# Patient Record
Sex: Male | Born: 1950
Health system: Southern US, Community
[De-identification: ages and names within clinical notes are randomized; demographics above are authoritative.]

## PROBLEM LIST (undated history)

## (undated) DIAGNOSIS — N200 Calculus of kidney: Secondary | ICD-10-CM

## (undated) DIAGNOSIS — J302 Other seasonal allergic rhinitis: Secondary | ICD-10-CM

## (undated) DIAGNOSIS — I10 Essential (primary) hypertension: Secondary | ICD-10-CM

## (undated) DIAGNOSIS — R001 Bradycardia, unspecified: Secondary | ICD-10-CM

## (undated) HISTORY — DX: Bradycardia, unspecified: R00.1

## (undated) HISTORY — DX: Calculus of kidney: N20.0

---

## 1982-01-30 DIAGNOSIS — N200 Calculus of kidney: Secondary | ICD-10-CM

## 1982-01-30 HISTORY — PX: OTHER SURGICAL HISTORY: SHX169

## 1982-01-30 HISTORY — DX: Calculus of kidney: N20.0

## 2012-08-07 ENCOUNTER — Telehealth: Payer: Self-pay | Admitting: Family Medicine

## 2012-08-07 NOTE — Telephone Encounter (Signed)
Patient needs BW paperwork

## 2012-08-08 NOTE — Telephone Encounter (Signed)
Need chart

## 2012-08-12 ENCOUNTER — Other Ambulatory Visit: Payer: Self-pay | Admitting: *Deleted

## 2012-08-12 DIAGNOSIS — Z0000 Encounter for general adult medical examination without abnormal findings: Secondary | ICD-10-CM

## 2012-08-12 DIAGNOSIS — Z125 Encounter for screening for malignant neoplasm of prostate: Secondary | ICD-10-CM

## 2012-08-12 DIAGNOSIS — Z79899 Other long term (current) drug therapy: Secondary | ICD-10-CM

## 2012-08-12 DIAGNOSIS — E785 Hyperlipidemia, unspecified: Secondary | ICD-10-CM

## 2012-08-12 NOTE — Telephone Encounter (Signed)
bloodwork papers ready for pick with a note that he needs physical. Pt notified on his voicemail

## 2012-08-12 NOTE — Telephone Encounter (Signed)
Lip liv met7 psa Needs pe too

## 2012-08-14 LAB — BASIC METABOLIC PANEL
CO2: 27 mEq/L (ref 19–32)
Calcium: 9.2 mg/dL (ref 8.4–10.5)
Chloride: 107 mEq/L (ref 96–112)
Glucose, Bld: 69 mg/dL — ABNORMAL LOW (ref 70–99)
Potassium: 4.6 mEq/L (ref 3.5–5.3)
Sodium: 142 mEq/L (ref 135–145)

## 2012-08-14 LAB — HEPATIC FUNCTION PANEL
AST: 14 U/L (ref 0–37)
Bilirubin, Direct: 0.1 mg/dL (ref 0.0–0.3)
Total Bilirubin: 0.4 mg/dL (ref 0.3–1.2)

## 2012-08-14 LAB — LIPID PANEL
LDL Cholesterol: 155 mg/dL — ABNORMAL HIGH (ref 0–99)
Total CHOL/HDL Ratio: 4.1 Ratio

## 2012-08-15 LAB — PSA: PSA: 0.66 ng/mL (ref <=4.00)

## 2012-08-17 ENCOUNTER — Encounter: Payer: Self-pay | Admitting: *Deleted

## 2012-08-19 ENCOUNTER — Ambulatory Visit (INDEPENDENT_AMBULATORY_CARE_PROVIDER_SITE_OTHER): Payer: BC Managed Care – PPO | Admitting: Family Medicine

## 2012-08-19 ENCOUNTER — Encounter: Payer: Self-pay | Admitting: Family Medicine

## 2012-08-19 VITALS — BP 122/78 | HR 60 | Ht 65.75 in | Wt 167.0 lb

## 2012-08-19 DIAGNOSIS — Z0000 Encounter for general adult medical examination without abnormal findings: Secondary | ICD-10-CM

## 2012-08-19 DIAGNOSIS — E785 Hyperlipidemia, unspecified: Secondary | ICD-10-CM

## 2012-08-19 MED ORDER — HYDROCORTISONE 2.5 % RE CREA: TOPICAL_CREAM | Freq: Two times a day (BID) | RECTAL | Status: DC

## 2012-08-19 NOTE — Progress Notes (Signed)
Subjective:    Patient ID: Miguel Morrow, male    DOB: [birth year], 62 y.o.   MRN: [id number]  HPI Exercises some, but not a lot. Does that a bit, walks a half milre  Diet overall pretty good, Eats stable  Scheduled colonoscopy for the first week of august Hemorrhoids flare up on occaison.  Results for orders placed in visit on 08/12/12  LIPID PANEL      Result Value Range   Cholesterol 221 (*) 0 - 200 mg/dL   Triglycerides 61  <150 mg/dL   HDL 54  >39 mg/dL   Total CHOL/HDL Ratio 4.1     VLDL 12  0 - 40 mg/dL   LDL Cholesterol 155 (*) 0 - 99 mg/dL  HEPATIC FUNCTION PANEL      Result Value Range   Total Bilirubin 0.4  0.3 - 1.2 mg/dL   Bilirubin, Direct 0.1  0.0 - 0.3 mg/dL   Indirect Bilirubin 0.3  0.0 - 0.9 mg/dL   Alkaline Phosphatase 41  39 - 117 U/L   AST 14  0 - 37 U/L   ALT 17  0 - 53 U/L   Total Protein 6.6  6.0 - 8.3 g/dL   Albumin 4.3  3.5 - 5.2 g/dL  BASIC METABOLIC PANEL      Result Value Range   Sodium 142  135 - 145 mEq/L   Potassium 4.6  3.5 - 5.3 mEq/L   Chloride 107  96 - 112 mEq/L   CO2 27  19 - 32 mEq/L   Glucose, Bld 69 (*) 70 - 99 mg/dL   BUN 20  6 - 23 mg/dL   Creat 0.77  0.50 - 1.35 mg/dL   Calcium 9.2  8.4 - 10.5 mg/dL  PSA      Result Value Range   PSA 0.66  <=4.00 ng/mL     Review of Systems  Constitutional: Negative for fever, activity change and appetite change.  HENT: Negative for congestion, rhinorrhea and neck pain.   Eyes: Negative for discharge.  Respiratory: Negative for cough and wheezing.   Cardiovascular: Negative for chest pain.  Gastrointestinal: Negative for vomiting, abdominal pain and blood in stool.  Genitourinary: Negative for frequency and difficulty urinating.  Skin: Negative for rash.  Allergic/Immunologic: Negative for environmental allergies and food allergies.  Neurological: Negative for weakness and headaches.  Psychiatric/Behavioral: Negative for agitation.       Objective:   Physical Exam   Constitutional: He appears well-developed and well-nourished.  HENT:  Head: Normocephalic and atraumatic.  Right Ear: External ear normal.  Left Ear: External ear normal.  Nose: Nose normal.  Mouth/Throat: Oropharynx is clear and moist.  Eyes: EOM are normal. Pupils are equal, round, and reactive to light.  Neck: Normal range of motion. Neck supple. No thyromegaly present.  Cardiovascular: Normal rate, regular rhythm and normal heart sounds.   No murmur heard. Pulmonary/Chest: Effort normal and breath sounds normal. No respiratory distress. He has no wheezes.  Abdominal: Soft. Bowel sounds are normal. He exhibits no distension and no mass. There is no tenderness.  Genitourinary: Prostate normal and penis normal. No penile tenderness.  Musculoskeletal: Normal range of motion. He exhibits no edema.  Lymphadenopathy:    He has no cervical adenopathy.  Neurological: He is alert. He exhibits normal muscle tone.  Skin: Skin is warm and dry. No erythema.  Psychiatric: He has a normal mood and affect. His behavior is normal. Judgment normal.  Assessment & Plan:  Impression wellness exam #2 intermittent irritation hemorrhoids #3 hyperlipidemia discussed plan diet diet and exercise encourage. Patient to have colonoscopy soon. If cholesterol does not improve will need to consider medicine. WSL

## 2012-08-19 NOTE — Patient Instructions (Addendum)

## 2012-08-20 DIAGNOSIS — E782 Mixed hyperlipidemia: Secondary | ICD-10-CM | POA: Insufficient documentation

## 2012-08-27 ENCOUNTER — Telehealth: Payer: Self-pay

## 2012-08-27 NOTE — Telephone Encounter (Signed)
Pt was referred by Dr. Mickie Hillier for screening colonoscopy/ Hemorrhoid/ OV with LL on 09/24/2012 at 9:00 AM.

## 2012-09-24 ENCOUNTER — Ambulatory Visit (INDEPENDENT_AMBULATORY_CARE_PROVIDER_SITE_OTHER): Payer: BC Managed Care – PPO | Admitting: Gastroenterology

## 2012-09-24 ENCOUNTER — Encounter: Payer: Self-pay | Admitting: Gastroenterology

## 2012-09-24 VITALS — BP 150/79 | HR 51 | Temp 97.4°F | Ht 65.0 in | Wt 169.0 lb

## 2012-09-24 DIAGNOSIS — R194 Change in bowel habit: Secondary | ICD-10-CM | POA: Insufficient documentation

## 2012-09-24 DIAGNOSIS — K649 Unspecified hemorrhoids: Secondary | ICD-10-CM | POA: Insufficient documentation

## 2012-09-24 DIAGNOSIS — R198 Other specified symptoms and signs involving the digestive system and abdomen: Secondary | ICD-10-CM

## 2012-09-24 MED ORDER — PEG-KCL-NACL-NASULF-NA ASC-C 100 G PO SOLR: 1.0000 | ORAL | Status: DC

## 2012-09-24 NOTE — Progress Notes (Signed)
CC'd to PCP 

## 2012-09-24 NOTE — Progress Notes (Signed)
Primary Care Physician:  Rubbie Battiest, MD  Primary Gastroenterologist:  Garfield Cornea, MD   Chief Complaint  Patient presents with  . Colonoscopy  . Hemorrhoids    HPI:  Miguel Morrow is a 62 y.o. male here for consideration of a colonoscopy. Overall he is doing well. November treated for hemorrhoids with hemorrhoid suppositories. Recently started itching again. No recent rectal bleeding. Soft stool twice per day for the past few weeks. Usually formed stool. Little abdominal pain after meals. Some foods will have BM within one hour. [place], [place]. No heartburn. No dysphagia. No weight loss. Good appetite. No prior colonoscopy.   Current Outpatient Prescriptions  Medication Sig Dispense Refill  . aspirin 325 MG tablet Take 325 mg by mouth daily.      . Aspirin-Caffeine (BAYER BACK & BODY PAIN EX ST) 500-32.5 MG TABS Take by mouth as needed.      . hydrocortisone (ANUSOL-HC) 2.5 % rectal cream Place rectally 2 (two) times daily.  30 g  2   No current facility-administered medications for this visit.    Allergies as of 09/24/2012  . (No Known Allergies)    Past Medical History  Diagnosis Date  . Kidney stones 1984    History reviewed. No pertinent past surgical history.  Family History  Problem Relation Age of Onset  . Colon cancer Paternal Aunt     greater than age of 6  . Liver disease Neg Hx   . Heart disease Father     History   Social History  . Marital Status: Married    Spouse Name: N/A    Number of Children: 2  . 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 Smoking status: Former Research scientist (life sciences)  . Smokeless tobacco: Former Systems developer    Quit date: 05/17/2008  . Alcohol Use: No  . Drug Use: No  . Sexual Activity: Not on file   Other Topics Concern  . Not on file   Social History Narrative  . No narrative on file      ROS:  General: Negative for anorexia, weight loss, fever, chills, fatigue, weakness. Eyes:  Negative for vision changes.  ENT: Negative for hoarseness, difficulty swallowing , nasal congestion. CV: Negative for chest pain, angina, palpitations, dyspnea on exertion, peripheral edema.  Respiratory: Negative for dyspnea at rest, dyspnea on exertion, cough, sputum, wheezing.  GI: See history of present illness. GU:  Negative for dysuria, hematuria, urinary incontinence, urinary frequency, nocturnal urination.  MS: Negative for joint pain, low back pain.  Derm: Negative for rash or itching.  Neuro: Negative for weakness, abnormal sensation, seizure, frequent headaches, memory loss, confusion.  Psych: Negative for anxiety, depression, suicidal ideation, hallucinations.  Endo: Negative for unusual weight change.  Heme: Negative for bruising or bleeding. Allergy: Negative for rash or hives.    Physical Examination:  BP 150/79  Pulse 51  Temp(Src) 97.4 F (36.3 C) (Oral)  Ht 5' 5"  (1.651 m)  Wt 169 lb (76.658 kg)  BMI 28.12 kg/m2   General: Well-nourished, well-developed in no acute distress.  Head: Normocephalic, atraumatic.   Eyes: Conjunctiva pink, no icterus. Mouth: Oropharyngeal mucosa moist and pink , no lesions erythema or exudate. Neck: Supple without thyromegaly, masses, or lymphadenopathy.  Lungs: Clear to auscultation bilaterally.  Heart: Regular rate and rhythm, no murmurs rubs or gallops.  Abdomen: Bowel sounds are normal, nontender, nondistended, no hepatosplenomegaly or masses, no abdominal bruits or  hernia , no rebound or guarding.   Rectal: Not performed Extremities: No lower extremity edema. No clubbing or deformities.  Neuro: Alert and oriented x 4 , grossly normal neurologically.  Skin: Warm and dry, no rash or jaundice.   Psych: Alert and cooperative, normal mood and affect.  Labs: Lab Results  Component Value Date   ALT 17 08/14/2012   AST 14 08/14/2012   ALKPHOS 41 08/14/2012   BILITOT 0.4 08/14/2012   Lab Results  Component Value Date    CREATININE 0.77 08/14/2012   BUN 20 08/14/2012   NA 142 08/14/2012   K 4.6 08/14/2012   CL 107 08/14/2012   CO2 27 08/14/2012     Imaging Studies: No results found.

## 2012-09-24 NOTE — Assessment & Plan Note (Signed)
62 year old gentleman who presents to schedule first ever screening colonoscopy. Last fall he had an episode of bright red blood per rectum and was diagnosed with hemorrhoid by his PCP. More recently he has had some change in bowel habits with softer stool but no increased frequency. Recommend colonoscopy in the near future. I have discussed the risks, alternatives, benefits with regards to but not limited to the risk of reaction to medication, bleeding, infection, perforation and the patient is agreeable to proceed. Written consent to be obtained.

## 2012-09-24 NOTE — Patient Instructions (Addendum)
We have scheduled a colonoscopy with Dr. Gala Romney. Please see separate instructions.

## 2012-09-25 ENCOUNTER — Encounter (HOSPITAL_COMMUNITY): Payer: Self-pay | Admitting: Pharmacy Technician

## 2012-09-26 ENCOUNTER — Ambulatory Visit (HOSPITAL_COMMUNITY)
Admission: RE | Admit: 2012-09-26 | Discharge: 2012-09-26 | Disposition: A | Discharge status: Home / Self Care | Payer: BC Managed Care – PPO | Source: Ambulatory Visit | Attending: Internal Medicine | Admitting: Internal Medicine

## 2012-09-26 ENCOUNTER — Encounter (HOSPITAL_COMMUNITY)
Admission: RE | Disposition: A | Discharge status: Home / Self Care | Payer: Self-pay | Source: Ambulatory Visit | Attending: Internal Medicine

## 2012-09-26 ENCOUNTER — Encounter (HOSPITAL_COMMUNITY): Payer: Self-pay | Admitting: *Deleted

## 2012-09-26 DIAGNOSIS — K621 Rectal polyp: Secondary | ICD-10-CM

## 2012-09-26 DIAGNOSIS — D126 Benign neoplasm of colon, unspecified: Secondary | ICD-10-CM | POA: Insufficient documentation

## 2012-09-26 DIAGNOSIS — K5730 Diverticulosis of large intestine without perforation or abscess without bleeding: Secondary | ICD-10-CM | POA: Insufficient documentation

## 2012-09-26 DIAGNOSIS — K620 Anal polyp: Secondary | ICD-10-CM

## 2012-09-26 DIAGNOSIS — D128 Benign neoplasm of rectum: Secondary | ICD-10-CM | POA: Insufficient documentation

## 2012-09-26 DIAGNOSIS — K649 Unspecified hemorrhoids: Secondary | ICD-10-CM

## 2012-09-26 DIAGNOSIS — Z1211 Encounter for screening for malignant neoplasm of colon: Secondary | ICD-10-CM | POA: Insufficient documentation

## 2012-09-26 HISTORY — PX: COLONOSCOPY: SHX5424

## 2012-09-26 SURGERY — COLONOSCOPY
Anesthesia: Moderate Sedation

## 2012-09-26 MED ORDER — STERILE WATER FOR IRRIGATION IR SOLN
Status: DC | PRN
  Administered 2012-09-26: 15:00:00

## 2012-09-26 MED ORDER — MIDAZOLAM HCL 5 MG/5ML IJ SOLN
INTRAMUSCULAR | Status: DC | PRN
  Administered 2012-09-26: 1 mg via INTRAVENOUS
  Administered 2012-09-26: 2 mg via INTRAVENOUS

## 2012-09-26 MED ORDER — SODIUM CHLORIDE 0.9 % IV SOLN
INTRAVENOUS | Status: DC
  Administered 2012-09-26: 1000 mL via INTRAVENOUS

## 2012-09-26 MED ORDER — MEPERIDINE HCL 100 MG/ML IJ SOLN
INTRAMUSCULAR | Status: DC | PRN
  Administered 2012-09-26: 50 mg via INTRAVENOUS

## 2012-09-26 MED ORDER — ONDANSETRON HCL 4 MG/2ML IJ SOLN
INTRAMUSCULAR | Status: AC
  Filled 2012-09-26: qty 2

## 2012-09-26 MED ORDER — ONDANSETRON HCL 4 MG/2ML IJ SOLN
INTRAMUSCULAR | Status: DC | PRN
  Administered 2012-09-26: 4 mg via INTRAVENOUS

## 2012-09-26 MED ORDER — MIDAZOLAM HCL 5 MG/5ML IJ SOLN
INTRAMUSCULAR | Status: AC
  Filled 2012-09-26: qty 10

## 2012-09-26 MED ORDER — MEPERIDINE HCL 100 MG/ML IJ SOLN
INTRAMUSCULAR | Status: AC
  Filled 2012-09-26: qty 2

## 2012-09-26 NOTE — H&P (View-Only) (Signed)
Primary Care Physician:  Rubbie Battiest, MD  Primary Gastroenterologist:  Garfield Cornea, MD   Chief Complaint  Patient presents with  . Colonoscopy  . Hemorrhoids    HPI:  Miguel Morrow is a 62 y.o. male here for consideration of a colonoscopy. Overall he is doing well. November treated for hemorrhoids with hemorrhoid suppositories. Recently started itching again. No recent rectal bleeding. Soft stool twice per day for the past few weeks. Usually formed stool. Little abdominal pain after meals. Some foods will have BM within one hour. [place], [place]. No heartburn. No dysphagia. No weight loss. Good appetite. No prior colonoscopy.   Current Outpatient Prescriptions  Medication Sig Dispense Refill  . aspirin 325 MG tablet Take 325 mg by mouth daily.      . Aspirin-Caffeine (BAYER BACK & BODY PAIN EX ST) 500-32.5 MG TABS Take by mouth as needed.      . hydrocortisone (ANUSOL-HC) 2.5 % rectal cream Place rectally 2 (two) times daily.  30 g  2   No current facility-administered medications for this visit.    Allergies as of 09/24/2012  . (No Known Allergies)    Past Medical History  Diagnosis Date  . Kidney stones 1984    History reviewed. No pertinent past surgical history.  Family History  Problem Relation Age of Onset  . Colon cancer Paternal Aunt     greater than age of 26  . Liver disease Neg Hx   . Heart disease Father     History   Social History  . Marital Status: Married    Spouse Name: N/A    Number of Children: 2  . 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 Smoking status: Former Research scientist (life sciences)  . Smokeless tobacco: Former Systems developer    Quit date: 05/17/2008  . Alcohol Use: No  . Drug Use: No  . Sexual Activity: Not on file   Other Topics Concern  . Not on file   Social History Narrative  . No narrative on file      ROS:  General: Negative for anorexia, weight loss, fever, chills, fatigue, weakness. Eyes:  Negative for vision changes.  ENT: Negative for hoarseness, difficulty swallowing , nasal congestion. CV: Negative for chest pain, angina, palpitations, dyspnea on exertion, peripheral edema.  Respiratory: Negative for dyspnea at rest, dyspnea on exertion, cough, sputum, wheezing.  GI: See history of present illness. GU:  Negative for dysuria, hematuria, urinary incontinence, urinary frequency, nocturnal urination.  MS: Negative for joint pain, low back pain.  Derm: Negative for rash or itching.  Neuro: Negative for weakness, abnormal sensation, seizure, frequent headaches, memory loss, confusion.  Psych: Negative for anxiety, depression, suicidal ideation, hallucinations.  Endo: Negative for unusual weight change.  Heme: Negative for bruising or bleeding. Allergy: Negative for rash or hives.    Physical Examination:  BP 150/79  Pulse 51  Temp(Src) 97.4 F (36.3 C) (Oral)  Ht 5' 5"  (1.651 m)  Wt 169 lb (76.658 kg)  BMI 28.12 kg/m2   General: Well-nourished, well-developed in no acute distress.  Head: Normocephalic, atraumatic.   Eyes: Conjunctiva pink, no icterus. Mouth: Oropharyngeal mucosa moist and pink , no lesions erythema or exudate. Neck: Supple without thyromegaly, masses, or lymphadenopathy.  Lungs: Clear to auscultation bilaterally.  Heart: Regular rate and rhythm, no murmurs rubs or gallops.  Abdomen: Bowel sounds are normal, nontender, nondistended, no hepatosplenomegaly or masses, no abdominal bruits or  hernia , no rebound or guarding.   Rectal: Not performed Extremities: No lower extremity edema. No clubbing or deformities.  Neuro: Alert and oriented x 4 , grossly normal neurologically.  Skin: Warm and dry, no rash or jaundice.   Psych: Alert and cooperative, normal mood and affect.  Labs: Lab Results  Component Value Date   ALT 17 08/14/2012   AST 14 08/14/2012   ALKPHOS 41 08/14/2012   BILITOT 0.4 08/14/2012   Lab Results  Component Value Date    CREATININE 0.77 08/14/2012   BUN 20 08/14/2012   NA 142 08/14/2012   K 4.6 08/14/2012   CL 107 08/14/2012   CO2 27 08/14/2012     Imaging Studies: No results found.

## 2012-09-26 NOTE — Interval H&P Note (Signed)
History and Physical Interval Note:  09/26/2012 2:46 PM  Miguel Morrow  has presented today for surgery, with the diagnosis of Hemorrhoid  The various methods of treatment have been discussed with the patient and family. After consideration of risks, benefits and other options for treatment, the patient has consented to  Procedure(s) with comments: COLONOSCOPY (N/A) - 1:45 as a surgical intervention .  The patient's history has been reviewed, patient examined, no change in status, stable for surgery.  I have reviewed the patient's chart and labs.  Questions were answered to the patient's satisfaction.     No change. Colonoscopy per plan.  The risks, benefits, limitations, alternatives and imponderables have been reviewed with the patient. Questions have been answered. All parties are agreeable.   Manus Rudd

## 2012-09-26 NOTE — Op Note (Signed)
[facility] [address] [place], [address]   COLONOSCOPY PROCEDURE REPORT  PATIENT: Miguel Morrow, Miguel Morrow  MR#:         [id number] BIRTHDATE: [birth year] , 61  yrs. old GENDER: Male ENDOSCOPIST: R.  Garfield Cornea, MD FACP FACG REFERRED BY:  Sallee Lange, M.D. PROCEDURE DATE:  09/26/2012 PROCEDURE:     Colonoscopy with snare polypectomy  INDICATIONS: First-ever average risk colorectal cancer screening examination  INFORMED CONSENT:  The risks, benefits, alternatives and imponderables including but not limited to bleeding, perforation as well as the possibility of a missed lesion have been reviewed.  The potential for biopsy, lesion removal, etc. have also been discussed.  Questions have been answered.  All parties agreeable. Please see the history and physical in the medical record for more information.  MEDICATIONS: Versed 3 mg IV and Demerol 50 mg IV in divided doses. Zofran 4 mg IV  DESCRIPTION OF PROCEDURE:  After a digital rectal exam was performed, the EC-[id number] ([id number])  colonoscope was advanced from the anus through the rectum and colon to the area of the cecum, ileocecal valve and appendiceal orifice.  The cecum was deeply intubated.  These structures were well-seen and photographed for the record.  From the level of the cecum and ileocecal valve, the scope was slowly and cautiously withdrawn.  The mucosal surfaces were carefully surveyed utilizing scope tip deflection to facilitate fold flattening as needed.  The scope was pulled down into the rectum where a thorough examination including retroflexion was performed.    FINDINGS:  Adequate preparation. (1) 8 mm pedunculated polyp in the rectum at 6 cm  from the anal verge; otherwise, the remainder of the rectal mucosa appeared normal. The patient also had scattered left-sided diverticula; (1) 8 mm polyp at the splenic flexure; otherwise, the remainder of the colonic mucosa appeared  normal.  THERAPEUTIC / DIAGNOSTIC MANEUVERS PERFORMED:  The above-mentioned polyps were hot snare removed and recovered for the pathologist  COMPLICATIONS: None.  CECAL WITHDRAWAL TIME:  10 minutes  IMPRESSION:  Rectal and colonic polyps-removed as described above. Colonic diverticulosis  The patient was observed to have a sinus bradycardia before, during and after the procedure. His rate dropped down as low as the upper 30s prior to the initiation of the procedure. He maintained a normal blood pressure and apparently has had no cardiac symptoms, whatsoever.   RECOMMENDATIONS:   Followup on pathology. I've advised the patient and family members to make an appointment to see Dr. Wolfgang Phoenix within the next week to further evaluate the  bradycardia observe today   _______________________________ eSigned:  R. Garfield Cornea, MD FACP [facility] 09/26/2012 3:20 PM   CC:    PATIENT NAME:  Miguel Morrow, Miguel Morrow MR#: [id number]

## 2012-10-01 ENCOUNTER — Encounter (HOSPITAL_COMMUNITY): Payer: Self-pay | Admitting: Internal Medicine

## 2012-10-02 ENCOUNTER — Encounter: Payer: Self-pay | Admitting: Family Medicine

## 2012-10-02 ENCOUNTER — Ambulatory Visit (INDEPENDENT_AMBULATORY_CARE_PROVIDER_SITE_OTHER): Payer: BC Managed Care – PPO | Admitting: Family Medicine

## 2012-10-02 ENCOUNTER — Encounter: Payer: Self-pay | Admitting: Internal Medicine

## 2012-10-02 VITALS — BP 138/94 | HR 64 | Ht 65.0 in | Wt 168.0 lb

## 2012-10-02 DIAGNOSIS — I498 Other specified cardiac arrhythmias: Secondary | ICD-10-CM

## 2012-10-02 DIAGNOSIS — R001 Bradycardia, unspecified: Secondary | ICD-10-CM

## 2012-10-02 NOTE — Progress Notes (Signed)
  Subjective:    Patient ID: Miguel Morrow, male    DOB: 1950-05-29, 62 y.o.   MRN: [id number]  [facility] colonoscopy last Thursday. Pt stated his heart rate dropped down 33 and never got above 53 during procedure.   There is a strong family history of early coronary artery disease and this has of patient concern.  Patient gets lightheaded at times, generally when standing quickly. No syncope.  He reports the gastroenterologist seem to be very concerned and his heart rate dropped. They did not send to the emergency room. Advise him to be sure he follows up with [place].  Patient has nonspecific pressure in the chest very sporadic not associated with exercise. No associated nausea diaphoresis shortness of breath.  Historically of note patient's father in and up having a pacemaker  Review of Systems Review systems otherwise normal.    Objective:   Physical Exam  Alert no acute distress. HEENT normal. Blood pressure repeat 1 3482. Lungs clear. Heart mild bradycardia noted abdomen benign  EKG sinus bradycardia. Nonspecific ST-T changes. Heart rate upper 40s      Assessment & Plan:  Impression bradycardia likely benign however with other features risk factors further evaluation warranted discussed at length 25 minutes spent most in discussion. Plan cardiology consult rationale discussed. WSL

## 2012-10-03 DIAGNOSIS — R001 Bradycardia, unspecified: Secondary | ICD-10-CM | POA: Insufficient documentation

## 2012-10-21 ENCOUNTER — Encounter: Payer: Self-pay | Admitting: Cardiology

## 2012-10-22 ENCOUNTER — Encounter: Payer: Self-pay | Admitting: *Deleted

## 2012-10-22 ENCOUNTER — Ambulatory Visit (INDEPENDENT_AMBULATORY_CARE_PROVIDER_SITE_OTHER): Payer: BC Managed Care – PPO | Admitting: Cardiology

## 2012-10-22 ENCOUNTER — Encounter: Payer: Self-pay | Admitting: Cardiology

## 2012-10-22 VITALS — BP 121/59 | HR 57 | Ht 65.0 in | Wt 167.0 lb

## 2012-10-22 DIAGNOSIS — R42 Dizziness and giddiness: Secondary | ICD-10-CM

## 2012-10-22 DIAGNOSIS — I498 Other specified cardiac arrhythmias: Secondary | ICD-10-CM

## 2012-10-22 DIAGNOSIS — R072 Precordial pain: Secondary | ICD-10-CM | POA: Insufficient documentation

## 2012-10-22 DIAGNOSIS — R001 Bradycardia, unspecified: Secondary | ICD-10-CM

## 2012-10-22 NOTE — Assessment & Plan Note (Signed)
Sinus bradycardia noted today, not certain about specific rhythm with recent colonoscopy. [place] bradycardia described, no ECG available to review. Tracing from today shows sinus rhythm with PVCs and nonspecific ST changes. He does indicate occasional feeling of lightheadedness when he stands up, no frank syncope. He is not on any medications that should be contributing to his bradycardia. We will obtain a 24-hour Holter to better investigate heart rate trend.

## 2012-10-22 NOTE — Assessment & Plan Note (Signed)
Has occasional feeling of chest tightness when at work, associated with physical activity. He has a family history of CAD in male relatives, no personal risk factors of hypertension, hyperlipidemia, or diabetes mellitus. He has not undergone any prior ischemic workup, and we will proceed with a [place] for further investigation. Office followup arranged to review.

## 2012-10-22 NOTE — Patient Instructions (Addendum)
Your physician recommends that you schedule a follow-up appointment in: after tests are complete  Your physician has recommended that you wear a holter monitor. Holter monitors are medical devices that record the heart's electrical activity. Doctors most often use these monitors to diagnose arrhythmias. Arrhythmias are problems with the speed or rhythm of the heartbeat. The monitor is a small, portable device. You can wear one while you do your normal daily activities. This is usually used to diagnose what is causing palpitations/syncope (passing out).  Your physician has requested that you have en exercise stress myoview. For further information please visit [email or web address][employer][email or web address]. Please follow instruction sheet, as given.

## 2012-10-22 NOTE — Progress Notes (Signed)
Clinical Summary Miguel Morrow is a 62 y.o.male referred for cardiology consultation by Dr. Wolfgang Phoenix. During recent colonoscopy, he was observed to have bradycardia. Records indicate sinus bradycardia with lowest heart rate in the high 30s, however stable blood pressure. No recent ECG available at this time.  He reports no history of syncope, occasionally feels lightheaded if he stands up quickly. He also reports a feeling of "tightness" in his left lower chest area at work. 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  He has not undergone any prior cardiac testing. Does have family history of cardiovascular disease in first-degree male relatives. Father also required a pacemaker.  ECG done in the office today shows sinus rhythm with nonspecific ST changes and PVCs.  No Known Allergies  Current Outpatient Prescriptions  Medication Sig Dispense Refill  . Aspirin-Caffeine (BAYER BACK & BODY PAIN EX ST) 500-32.5 MG TABS Take 1 tablet by mouth as needed (Pain).       Marland Kitchen OVER THE COUNTER MEDICATION Sinus med as needed       No current facility-administered medications for this visit.    Past Medical History  Diagnosis Date  . Nephrolithiasis 1984    Past Surgical History  Procedure Laterality Date  . Kidney stone basket extraction  1984  . Colonoscopy N/A 09/26/2012    Procedure: COLONOSCOPY;  Surgeon: Daneil Dolin, MD;  Location: [facility];  Service: Endoscopy;  Laterality: N/A;  1:45    Family History  Problem Relation Age of Onset  . Colon cancer Paternal Aunt     Greater than age of 24  . Liver disease Neg Hx   . Heart disease Father     Died age 62  . Heart disease Paternal Uncle     Social History Miguel Morrow reports that he has quit smoking. His smoking use included Cigarettes. He smoked 0.00 packs per day. He quit smokeless tobacco use about 4 years ago. Miguel Morrow reports that he does not drink  alcohol.  Review of Systems No palpitations, no claudication. No recent problems with kidney stones. Stable appetite. No cough, fevers or chills. Otherwise negative.  Physical Examination Filed Vitals:   10/22/12 1334  BP: 121/59  Pulse: 57   Filed Weights   10/22/12 1334  Weight: 167 lb (75.751 kg)   Patient appears comfortable. HEENT: Conjunctiva and lids normal, oropharynx clear with moist mucosa. Neck: Supple, no elevated JVP or carotid bruits, no thyromegaly. Lungs: Clear to auscultation, nonlabored breathing at rest. Cardiac: Regular rate and rhythm, no S3 or significant systolic murmur, no pericardial rub. Abdomen: Soft, nontender, bowel sounds present, no guarding or rebound. Extremities: No pitting edema, distal pulses 2+. Skin: Warm and dry. Musculoskeletal: No kyphosis. Neuropsychiatric: Alert and oriented x3, affect grossly appropriate.   Problem List and Plan   Bradycardia Sinus bradycardia noted today, not certain about specific rhythm with recent colonoscopy. [place] bradycardia described, no ECG available to review. Tracing from today shows sinus rhythm with PVCs and nonspecific ST changes. He does indicate occasional feeling of lightheadedness when he stands up, no frank syncope. He is not on any medications that should be contributing to his bradycardia. We will obtain a 24-hour Holter to better investigate heart rate trend.  Precordial pain Has occasional feeling of chest tightness when at work, associated with physical activity. He has a family history of CAD in male relatives, no personal risk factors of hypertension,  hyperlipidemia, or diabetes mellitus. He has not undergone any prior ischemic workup, and we will proceed with a [place] for further investigation. Office followup arranged to review.    Satira Sark, M.D., F.A.C.C.

## 2012-10-31 ENCOUNTER — Encounter (HOSPITAL_COMMUNITY)
Admission: RE | Admit: 2012-10-31 | Discharge: 2012-10-31 | Disposition: A | Discharge status: Home / Self Care | Payer: BC Managed Care – PPO | Source: Ambulatory Visit | Attending: Cardiology | Admitting: Cardiology

## 2012-10-31 ENCOUNTER — Encounter (HOSPITAL_COMMUNITY): Payer: Self-pay

## 2012-10-31 ENCOUNTER — Encounter (HOSPITAL_COMMUNITY): Payer: BC Managed Care – PPO

## 2012-10-31 ENCOUNTER — Ambulatory Visit (HOSPITAL_COMMUNITY)
Admission: RE | Admit: 2012-10-31 | Discharge: 2012-10-31 | Disposition: A | Discharge status: Home / Self Care | Payer: BC Managed Care – PPO | Source: Ambulatory Visit | Attending: Cardiology | Admitting: Cardiology

## 2012-10-31 DIAGNOSIS — R079 Chest pain, unspecified: Secondary | ICD-10-CM

## 2012-10-31 DIAGNOSIS — I498 Other specified cardiac arrhythmias: Secondary | ICD-10-CM | POA: Insufficient documentation

## 2012-10-31 DIAGNOSIS — R072 Precordial pain: Secondary | ICD-10-CM

## 2012-10-31 DIAGNOSIS — R0789 Other chest pain: Secondary | ICD-10-CM | POA: Insufficient documentation

## 2012-10-31 DIAGNOSIS — R001 Bradycardia, unspecified: Secondary | ICD-10-CM

## 2012-10-31 MED ORDER — TECHNETIUM TC 99M SESTAMIBI GENERIC - CARDIOLITE
30.0000 | Freq: Once | INTRAVENOUS | Status: AC | PRN
  Administered 2012-10-31: 30 via INTRAVENOUS

## 2012-10-31 MED ORDER — SODIUM CHLORIDE 0.9 % IJ SOLN
INTRAMUSCULAR | Status: AC
  Administered 2012-10-31: 10 mL via INTRAVENOUS
  Filled 2012-10-31: qty 10

## 2012-10-31 MED ORDER — TECHNETIUM TC 99M SESTAMIBI - CARDIOLITE
10.0000 | Freq: Once | INTRAVENOUS | Status: AC | PRN
  Administered 2012-10-31: 10 via INTRAVENOUS

## 2012-10-31 MED ORDER — REGADENOSON 0.4 MG/5ML IV SOLN
INTRAVENOUS | Status: AC
  Administered 2012-10-31: 0.4 mg via INTRAVENOUS
  Filled 2012-10-31: qty 5

## 2012-10-31 NOTE — Progress Notes (Signed)
24 hour Holter Monitor in progress. 

## 2012-10-31 NOTE — Progress Notes (Signed)
Stress Lab Nurses Notes - [facility]  [facility] 10/31/2012 Reason for doing test: Bradycardia & chest tightness Type of test: Test Changed unable to reach THR, Lexiscan given. Nurse performing test: Gerrit Halls, RN Nuclear Medicine Tech: Melburn Hake Echo Tech: Not Applicable MD performing test: Dr. Jefm Bryant MD: Mickie Hillier Test explained and consent signed: yes IV started: 22g jelco, Saline lock flushed, No redness or edema and Saline lock started in radiology Symptoms:  SOB & fatigue in legs during TM & dizziness with Lexiscan Treatment/Intervention: None Reason test stopped: fatigue After recovery IV was: Discontinued via X-ray tech and No redness or edema Patient to return to [facility]. Med at : 11:45 Patient discharged: Home Patient's Condition upon discharge was: stable Comments: During test peak BP 181/73 & HR 114.  Recovery BP 125/71 & HR 67.  Symptoms resolved in recovery. Geanie Cooley T

## 2012-11-04 ENCOUNTER — Other Ambulatory Visit: Payer: Self-pay | Admitting: *Deleted

## 2012-11-04 DIAGNOSIS — R42 Dizziness and giddiness: Secondary | ICD-10-CM

## 2012-11-04 DIAGNOSIS — R001 Bradycardia, unspecified: Secondary | ICD-10-CM

## 2012-11-12 ENCOUNTER — Encounter: Payer: Self-pay | Admitting: Family Medicine

## 2012-11-21 ENCOUNTER — Encounter: Payer: Self-pay | Admitting: Cardiology

## 2012-11-21 ENCOUNTER — Ambulatory Visit (INDEPENDENT_AMBULATORY_CARE_PROVIDER_SITE_OTHER): Payer: BC Managed Care – PPO | Admitting: Cardiology

## 2012-11-21 VITALS — BP 159/74 | HR 50 | Ht 65.0 in | Wt 170.0 lb

## 2012-11-21 DIAGNOSIS — E782 Mixed hyperlipidemia: Secondary | ICD-10-CM

## 2012-11-21 DIAGNOSIS — R9439 Abnormal result of other cardiovascular function study: Secondary | ICD-10-CM | POA: Insufficient documentation

## 2012-11-21 DIAGNOSIS — R001 Bradycardia, unspecified: Secondary | ICD-10-CM

## 2012-11-21 DIAGNOSIS — I498 Other specified cardiac arrhythmias: Secondary | ICD-10-CM

## 2012-11-21 MED ORDER — SIMVASTATIN 20 MG PO TABS: 10.0000 mg | ORAL_TABLET | Freq: Every day | ORAL | Status: DC

## 2012-11-21 MED ORDER — NITROGLYCERIN 0.4 MG SL SUBL: 0.4000 mg | SUBLINGUAL_TABLET | SUBLINGUAL | Status: DC | PRN

## 2012-11-21 NOTE — Patient Instructions (Addendum)
Your physician recommends that you schedule a follow-up appointment in: 3 months  Your physician has recommended you make the following change in your medication:   1) START TAKING ASPIRIN 81MG ONCE DAILY 2) START TAKING ZOCOR 10MG EVERY NIGHT AT BEDTIME (HALF OF THE 20MG TABLET PRESCRIBED)  3) START TAKING NITRO 0.4MG SUBLINGUAL The proper use and anticipated side effects of nitroglycerine has been carefully explained.  If a single episode of chest pain is not relieved by one tablet, the patient will try another within 5 minutes; and if this doesn't relieve the pain, the patient is instructed to call 911 for transportation to an emergency department. PLEASE SIT DOWN PRIOR TO TAKING THE NITRO PER CAN CAUSE HEADACHE AND BLOOD PRESSURE TO DROP AND POSSIBLE FALL RISK   Your physician recommends that you return for lab work in: 2 MONTHS PRIOR TO YOUR 3 [place] UP/ Your physician recommends that you have follow up lab work, we will mail you a reminder letter to alert you when to go [employer], located across the street from our office.

## 2012-11-21 NOTE — Progress Notes (Signed)
Clinical Summary Miguel Morrow is a 62 y.o.male last seen in September. He was referred with documented bradycardia during colonoscopy, also reporting symptoms of exertional chest tightness at times. He was referred for followup testing.  Cardiac monitor showed sinus rhythm, some bradycardia into the 30s, however no pauses or heart block. Exercise Cardiolite on 9/23 showed abnormal ST segment changes at maximum workload 13.4 METS without chest pain, burst of SVT and some PVCs, hypertensive response, perfusion imaging showing probable soft tissue attenuation in the anterior apex and inferior wall with no definite ischemia. LVEF was 53%. TID ratio was normal.  Lipid panel from July showed cholesterol 221, triglycerides 61, HDL 54, LDL 155. Liver panel was normal.  We reviewed the results of his testing today and discussed the implications. His stress test does not exclude underlying ischemic heart disease, although relatively low risk. He still reports mild potential angina symptoms, no major functional limitation. We discussed proceeding with a cardiac catheterization for definitive diagnosis, versus initiating medical regimen and maintaining followup. At this point he was most comfortable with observation.   No Known Allergies  Current Outpatient Prescriptions  Medication Sig Dispense Refill  . aspirin 81 MG tablet Take 81 mg by mouth daily.      . Aspirin-Caffeine (BAYER BACK & BODY PAIN EX ST) 500-32.5 MG TABS Take 1 tablet by mouth as needed (Pain).       . nitroGLYCERIN (NITROSTAT) 0.4 MG SL tablet Place 1 tablet (0.4 mg total) under the tongue every 5 (five) minutes as needed for chest pain.  25 tablet  5  . OVER THE COUNTER MEDICATION Sinus med as needed      . simvastatin (ZOCOR) 20 MG tablet Take 0.5 tablets (10 mg total) by mouth at bedtime.  90 tablet  1   No current facility-administered medications for this visit.    Past Medical History  Diagnosis Date  . Nephrolithiasis 1984     Social History Miguel Morrow reports that he has quit smoking. His smoking use included Cigarettes. He smoked 0.00 packs per day. He quit smokeless tobacco use about 4 years ago. Miguel Morrow reports that he does not drink alcohol.  Review of Systems No syncope. Stable appetite. No orthopnea or PND. No claudication. Otherwise negative.  Physical Examination Filed Vitals:   11/21/12 0812  BP: 159/74  Pulse: 50   Filed Weights   11/21/12 0812  Weight: 170 lb (77.111 kg)    Patient appears comfortable.  HEENT: Conjunctiva and lids normal, oropharynx clear with moist mucosa.  Neck: Supple, no elevated JVP or carotid bruits, no thyromegaly.  Lungs: Clear to auscultation, nonlabored breathing at rest.  Cardiac: Regular rate and rhythm, no S3 or significant systolic murmur, no pericardial rub.  Abdomen: Soft, nontender, bowel sounds present, no guarding or rebound.  Extremities: No pitting edema, distal pulses 2+.  Skin: Warm and dry.  Musculoskeletal: No kyphosis.  Neuropsychiatric: Alert and oriented x3, affect grossly appropriate.     Problem List and Plan   Abnormal cardiovascular stress test Reviewed above. Patient reports some dyspnea on exertion and potential angina symptoms. Although low risk, his recent Cardiolite does not exclude underlying ischemic heart disease. We discussed this, and as noted above, our plan will be medical therapy and observation for now. I have asked him to start an aspirin 81 mg daily, we provided a prescription for as needed nitroglycerin, and will also initiate low-dose statin for reduction of LDL under 100. Office follow up is arranged.  He should let us know sooner if symptoms escalate.  Bradycardia Not entirely clear that this is symptomatic. He had no pauses by recent monitoring, although episodes of significant bradycardia were seen. For now will continue observation. I am not placing him on a beta blocker due to this.    Satira Sark,  M.D., F.A.C.C.

## 2012-11-21 NOTE — Assessment & Plan Note (Signed)
Not entirely clear that this is symptomatic. He had no pauses by recent monitoring, although episodes of significant bradycardia were seen. For now will continue observation. I am not placing him on a beta blocker due to this.

## 2012-11-21 NOTE — Assessment & Plan Note (Signed)
Reviewed above. Patient reports some dyspnea on exertion and potential angina symptoms. Although low risk, his recent Cardiolite does not exclude underlying ischemic heart disease. We discussed this, and as noted above, our plan will be medical therapy and observation for now. I have asked him to start an aspirin 81 mg daily, we provided a prescription for as needed nitroglycerin, and will also initiate low-dose statin for reduction of LDL under 100. Office follow up is arranged. He should let us know sooner if symptoms escalate.

## 2013-01-02 ENCOUNTER — Encounter: Payer: Self-pay | Admitting: *Deleted

## 2013-01-02 ENCOUNTER — Other Ambulatory Visit: Payer: Self-pay | Admitting: *Deleted

## 2013-01-02 DIAGNOSIS — E782 Mixed hyperlipidemia: Secondary | ICD-10-CM

## 2013-01-08 LAB — HEPATIC FUNCTION PANEL
Bilirubin, Direct: 0.1 mg/dL (ref 0.0–0.3)
Indirect Bilirubin: 0.4 mg/dL (ref 0.0–0.9)

## 2013-01-08 LAB — LIPID PANEL
HDL: 63 mg/dL (ref >39)
LDL Cholesterol: 118 mg/dL — ABNORMAL HIGH (ref 0–99)
Triglycerides: 53 mg/dL (ref <150)
VLDL: 11 mg/dL (ref 0–40)

## 2013-01-10 ENCOUNTER — Other Ambulatory Visit: Payer: Self-pay | Admitting: *Deleted

## 2013-01-10 ENCOUNTER — Encounter: Payer: Self-pay | Admitting: *Deleted

## 2013-01-10 MED ORDER — SIMVASTATIN 20 MG PO TABS: 20.0000 mg | ORAL_TABLET | Freq: Every day | ORAL | Status: DC

## 2013-03-02 ENCOUNTER — Encounter: Payer: Self-pay | Admitting: Cardiology

## 2013-03-02 NOTE — Progress Notes (Signed)
    Clinical Summary [name] is a 63 y.o.male last seen in October 2014. He states that he has been doing well, no exertional chest pain. Occasionally feels dizzy if he bends over or strains, no syncope. He reports tolerating his medications. He has not needed any nitroglycerin.  Prior cardiac testing reviewed. Cardiac monitor showed sinus rhythm, some bradycardia into the 30s, however no pauses or heart block. Exercise Cardiolite on 9/23 showed abnormal ST segment changes at maximum workload 13.4 METS without chest pain, burst of SVT and some PVCs, hypertensive response, perfusion imaging showing probable soft tissue attenuation in the anterior apex and inferior wall with no definite ischemia. LVEF was 53%. TID ratio was normal.  Labwork from December 2014 showed cholesterol 192, HDL 63, LDL 118, TG 53. Zocor was increased to 20 mg daily.  No Known Allergies  Current Outpatient Prescriptions  Medication Sig Dispense Refill  . aspirin 81 MG tablet Take 81 mg by mouth daily.      . Aspirin-Caffeine (BAYER BACK & BODY PAIN EX ST) 500-32.5 MG TABS Take 1 tablet by mouth as needed (Pain).       . nitroGLYCERIN (NITROSTAT) 0.4 MG SL tablet Place 1 tablet (0.4 mg total) under the tongue every 5 (five) minutes as needed for chest pain.  25 tablet  5  . OVER THE COUNTER MEDICATION Sinus med as needed      . simvastatin (ZOCOR) 20 MG tablet Take 1 tablet (20 mg total) by mouth at bedtime.  90 tablet  1   No current facility-administered medications for this visit.    Past Medical History  Diagnosis Date  . Nephrolithiasis 1984  . Bradycardia     Social History [name] reports that he has quit smoking. His smoking use included Cigarettes. He smoked 0.00 packs per day. He quit smokeless tobacco use about 4 years ago. [name] reports that he does not drink alcohol.  Review of Systems Arthritic pains. Otherwise negative except as outlined.  Physical Examination Filed Vitals:   03/03/13 0804  BP: 169/74  Pulse: 52   Filed Weights   03/03/13 0804  Weight: 169 lb (76.658 kg)    Patient appears comfortable.  HEENT: Conjunctiva and lids normal, oropharynx clear with moist mucosa.  Neck: Supple, no elevated JVP or carotid bruits, no thyromegaly.  Lungs: Clear to auscultation, nonlabored breathing at rest.  Cardiac: Regular rate and rhythm, no S3 or significant systolic murmur, no pericardial rub.  Abdomen: Soft, nontender, bowel sounds present, no guarding or rebound.  Extremities: No pitting edema, distal pulses 2+.    Problem List and Plan   Precordial pain This has resolved. Continue medical therapy and observation including aspirin, statin, and as needed nitroglycerin. Previous stress testing showed abnormal ST segment changes, although good workload of 13.4 METs, and no definite ischemic defects by Cardiolite. Followup 6 months.  Bradycardia Not on any rate lowering medications. Prior monitoring noted above. Continue observation for now.  Mixed hyperlipidemia Tolerating Zocor 20 mg daily. Followup FLP and LFT for next visit.    Satira Sark, M.D., F.A.C.C.

## 2013-03-03 ENCOUNTER — Encounter: Payer: Self-pay | Admitting: Cardiology

## 2013-03-03 ENCOUNTER — Ambulatory Visit (INDEPENDENT_AMBULATORY_CARE_PROVIDER_SITE_OTHER): Payer: BC Managed Care – PPO | Admitting: Cardiology

## 2013-03-03 ENCOUNTER — Encounter (INDEPENDENT_AMBULATORY_CARE_PROVIDER_SITE_OTHER): Payer: Self-pay

## 2013-03-03 VITALS — BP 169/74 | HR 52 | Ht 65.0 in | Wt 169.0 lb

## 2013-03-03 DIAGNOSIS — R072 Precordial pain: Secondary | ICD-10-CM

## 2013-03-03 DIAGNOSIS — E782 Mixed hyperlipidemia: Secondary | ICD-10-CM

## 2013-03-03 DIAGNOSIS — I498 Other specified cardiac arrhythmias: Secondary | ICD-10-CM

## 2013-03-03 DIAGNOSIS — R001 Bradycardia, unspecified: Secondary | ICD-10-CM

## 2013-03-03 NOTE — Patient Instructions (Signed)
Your physician wants you to follow-up in: 6 months You will receive a reminder letter in the mail two months in advance. If you don't receive a letter, please call our office to schedule the follow-up appointment.   Your physician recommends that you continue on your current medications as directed. Please refer to the Current Medication list given to you today.    Please get blood work just before your next visit  (lipid,lft's)

## 2013-03-03 NOTE — Assessment & Plan Note (Signed)
Not on any rate lowering medications. Prior monitoring noted above. Continue observation for now.

## 2013-03-03 NOTE — Assessment & Plan Note (Signed)
This has resolved. Continue medical therapy and observation including aspirin, statin, and as needed nitroglycerin. Previous stress testing showed abnormal ST segment changes, although good workload of 13.4 METs, and no definite ischemic defects by Cardiolite. Followup 6 months.

## 2013-03-03 NOTE — Assessment & Plan Note (Signed)
Tolerating Zocor 20 mg daily. Followup FLP and LFT for next visit.

## 2013-08-24 ENCOUNTER — Other Ambulatory Visit: Payer: Self-pay | Admitting: Cardiology

## 2013-09-03 ENCOUNTER — Other Ambulatory Visit: Payer: Self-pay | Admitting: Cardiology

## 2013-09-03 ENCOUNTER — Telehealth: Payer: Self-pay | Admitting: *Deleted

## 2013-09-03 LAB — LIPID PANEL
CHOL/HDL RATIO: 2.7 ratio
CHOLESTEROL: 167 mg/dL (ref 0–200)
HDL: 63 mg/dL (ref >39)
LDL Cholesterol: 94 mg/dL (ref 0–99)
TRIGLYCERIDES: 52 mg/dL (ref <150)
VLDL: 10 mg/dL (ref 0–40)

## 2013-09-03 LAB — HEPATIC FUNCTION PANEL
ALBUMIN: 4.3 g/dL (ref 3.5–5.2)
ALK PHOS: 39 U/L (ref 39–117)
ALT: 13 U/L (ref 0–53)
AST: 15 U/L (ref 0–37)
BILIRUBIN TOTAL: 0.3 mg/dL (ref 0.2–1.2)
Bilirubin, Direct: 0.1 mg/dL (ref 0.0–0.3)
Indirect Bilirubin: 0.2 mg/dL (ref 0.2–1.2)
TOTAL PROTEIN: 6.9 g/dL (ref 6.0–8.3)

## 2013-09-03 NOTE — Telephone Encounter (Signed)
Message copied by Orion Modest on Wed Sep 03, 2013  4:31 PM ------      Message from: Lendon Colonel      Created: Wed Sep 03, 2013  4:03 PM       Good cholesterol control without liver enzyme elevation. No changes in current regimen. ------

## 2013-09-12 ENCOUNTER — Ambulatory Visit (INDEPENDENT_AMBULATORY_CARE_PROVIDER_SITE_OTHER): Payer: BC Managed Care – PPO | Admitting: Cardiology

## 2013-09-12 ENCOUNTER — Encounter: Payer: Self-pay | Admitting: Cardiology

## 2013-09-12 VITALS — BP 156/65 | HR 51 | Ht 65.0 in | Wt 165.0 lb

## 2013-09-12 DIAGNOSIS — I498 Other specified cardiac arrhythmias: Secondary | ICD-10-CM

## 2013-09-12 DIAGNOSIS — I10 Essential (primary) hypertension: Secondary | ICD-10-CM

## 2013-09-12 DIAGNOSIS — E782 Mixed hyperlipidemia: Secondary | ICD-10-CM

## 2013-09-12 DIAGNOSIS — R072 Precordial pain: Secondary | ICD-10-CM

## 2013-09-12 DIAGNOSIS — R001 Bradycardia, unspecified: Secondary | ICD-10-CM

## 2013-09-12 NOTE — Assessment & Plan Note (Signed)
Better LDL control on Zocor. No changes made today.

## 2013-09-12 NOTE — Assessment & Plan Note (Signed)
Asymptomatic. ECG today shows sinus bradycardia. Continue observation.

## 2013-09-12 NOTE — Assessment & Plan Note (Signed)
This has not been recurrent. Continue observation.

## 2013-09-12 NOTE — Progress Notes (Signed)
    Clinical Summary Mr. Cubbage is a 63 y.o.male last seen in February of this year. He continues to work full-time at [employer]. He reports no angina symptoms in the last 6 months. No unusual shortness of breath. He has been compliant with his medications.  Recent lab work showed cholesterol 167, triglycerides 52, HDL 63, LDL 94, normal LFTs.  Exercise Cardiolite on 9/23 showed abnormal ST segment changes at maximum workload 13.4 METS without chest pain, burst of SVT and some PVCs, hypertensive response, perfusion imaging showing probable soft tissue attenuation in the anterior apex and inferior wall with no definite ischemia. LVEF was 53%. TID ratio was normal.   No Known Allergies  Current Outpatient Prescriptions  Medication Sig Dispense Refill  . aspirin 81 MG tablet Take 81 mg by mouth daily.      . Aspirin-Caffeine (BAYER BACK & BODY PAIN EX ST) 500-32.5 MG TABS Take 1 tablet by mouth as needed (Pain).       . nitroGLYCERIN (NITROSTAT) 0.4 MG SL tablet Place 1 tablet (0.4 mg total) under the tongue every 5 (five) minutes as needed for chest pain.  25 tablet  5  . OVER THE COUNTER MEDICATION Sinus med as needed      . simvastatin (ZOCOR) 20 MG tablet TAKE 1 TABLET BY MOUTH AT BEDTIME  30 tablet  1   No current facility-administered medications for this visit.    Past Medical History  Diagnosis Date  . Nephrolithiasis 1984  . Bradycardia     Social History Mr. Blagg reports that he has quit smoking. His smoking use included Cigarettes. He smoked 0.00 packs per day. He quit smokeless tobacco use about 5 years ago. Mr. Venable reports that he does not drink alcohol.  Review of Systems No palpitations, dizziness, syncope. No claudication. Other systems reviewed and negative.  Physical Examination Filed Vitals:   09/12/13 1016  BP: 156/65  Pulse: 51   Filed Weights   09/12/13 1016  Weight: 165 lb (74.844 kg)    Patient appears comfortable.  HEENT: Conjunctiva and lids  normal, oropharynx clear with moist mucosa.  Neck: Supple, no elevated JVP or carotid bruits, no thyromegaly.  Lungs: Clear to auscultation, nonlabored breathing at rest.  Cardiac: Regular rate and rhythm, no S3 or significant systolic murmur, no pericardial rub.  Abdomen: Soft, nontender, bowel sounds present, no guarding or rebound.  Extremities: No pitting edema, distal pulses 2+.    Problem List and Plan   Mixed hyperlipidemia Better LDL control on Zocor. No changes made today.  Bradycardia Asymptomatic. ECG today shows sinus bradycardia. Continue observation.  Precordial pain This has not been recurrent. Continue observation.    Satira Sark, M.D., F.A.C.C.

## 2013-09-12 NOTE — Patient Instructions (Signed)
Your physician wants you to follow-up in: 6 months You will receive a reminder letter in the mail two months in advance. If you don't receive a letter, please call our office to schedule the follow-up appointment.     Your physician recommends that you continue on your current medications as directed. Please refer to the Current Medication list given to you today.      Thank you for choosing [facility] !        

## 2014-02-09 ENCOUNTER — Ambulatory Visit (INDEPENDENT_AMBULATORY_CARE_PROVIDER_SITE_OTHER): Payer: BLUE CROSS/BLUE SHIELD | Admitting: Family Medicine

## 2014-02-09 ENCOUNTER — Encounter: Payer: Self-pay | Admitting: Family Medicine

## 2014-02-09 VITALS — BP 118/72 | Ht 65.75 in | Wt 163.0 lb

## 2014-02-09 DIAGNOSIS — M1711 Unilateral primary osteoarthritis, right knee: Secondary | ICD-10-CM

## 2014-02-09 DIAGNOSIS — R21 Rash and other nonspecific skin eruption: Secondary | ICD-10-CM | POA: Insufficient documentation

## 2014-02-09 DIAGNOSIS — G5622 Lesion of ulnar nerve, left upper limb: Secondary | ICD-10-CM | POA: Insufficient documentation

## 2014-02-09 MED ORDER — HYDROCORTISONE 2.5 % RE CREA: 1.0000 "application " | TOPICAL_CREAM | Freq: Two times a day (BID) | RECTAL | Status: DC

## 2014-02-09 MED ORDER — DICLOFENAC SODIUM 75 MG PO TBEC: 75.0000 mg | DELAYED_RELEASE_TABLET | Freq: Two times a day (BID) | ORAL | Status: DC

## 2014-02-09 NOTE — Progress Notes (Signed)
   Subjective:    Patient ID: Miguel Morrow, male    DOB: February 15, 1950, 64 y.o.   MRN: [id number]  HPI rectal irritation. Started about 1 month ago. Itching and swelling. Pain when wiping. No blood in stools. No constipation no abdominal pain   Put on simvastatin about 2 years ago by cardiologist. Pt states it is making his joints hurt. Would like to see if he can get something for the joint pain.   Elbows and knees primarily. Positive family history of arthritis.  And notes tingling in left arm. Left arm. Distribution of ulnar nerve. Struck elbow hard while working last week.    Review of Systems No chest pain no back pain no shortness of breath no headache    Objective:   Physical Exam  Alert vitals stable. HEENT normal. Lungs clear. Heart regular rate and rhythm. Perirectal irritation. Rectum otherwise normal prostate normal. Left hand strength sensation intact question paresthesia left ulnar distribution. Right hand normal. Some Heberden's nodes. Mild crepitation knees good range of motion      Assessment & Plan:  Impression 1 perirectal rash discussed #2 left ulnar neuropraxia discussed #3 arthritis discuss #4 simvastatin with importance to maintain discussed plan trial Voltaren twice a day with food when necessary for pain. Anusol HC cream when necessary. Expect gradual resolution left ulnar symptomatology. Stick with simvastatin. Diet exercise discussed. Wellness exam encourage. WSL

## 2014-04-02 ENCOUNTER — Encounter: Payer: Self-pay | Admitting: Cardiology

## 2014-04-02 ENCOUNTER — Ambulatory Visit (INDEPENDENT_AMBULATORY_CARE_PROVIDER_SITE_OTHER): Payer: BLUE CROSS/BLUE SHIELD | Admitting: Cardiology

## 2014-04-02 VITALS — BP 142/82 | HR 52 | Ht 65.0 in | Wt 166.0 lb

## 2014-04-02 DIAGNOSIS — E785 Hyperlipidemia, unspecified: Secondary | ICD-10-CM

## 2014-04-02 DIAGNOSIS — Z87898 Personal history of other specified conditions: Secondary | ICD-10-CM

## 2014-04-02 MED ORDER — SIMVASTATIN 10 MG PO TABS: 10.0000 mg | ORAL_TABLET | Freq: Every day | ORAL | Status: DC

## 2014-04-02 NOTE — Progress Notes (Signed)
Cardiology Office Note  Date: 04/02/2014   ID: Miguel Morrow, DOB 11/23/1950, MRN [id number]  PCP: Rubbie Battiest, MD  Primary Cardiologist: Rozann Lesches, MD   Chief Complaint  Patient presents with  . Follow-up chest pain    History of Present Illness: Miguel Morrow is a 64 y.o. male last seen in August 2015. He presents for a routine visit. Continues to work full time, does a lot of heavy lifting at work, and with this activity has not had any recurring chest pain symptoms. He has had some left arm paresthesias after hitting his olecranon region on that side. He also tells me that he experienced significant arthralgias and muscle weakness on Zocor 20 mg daily. He stopped the medicine for about a week and then cut it down to 10 mg daily and reported significant improvement in symptoms. Today we talked about reducing the dose, and perhaps considering a different preparation if we are not able to control his cholesterol over time.  We reviewed his medications, he otherwise continues on daily aspirin, has not required any nitroglycerin.   Past Medical History  Diagnosis Date  . Nephrolithiasis 1984  . Bradycardia     Current Outpatient Prescriptions  Medication Sig Dispense Refill  . aspirin 81 MG tablet Take 81 mg by mouth daily.    . diclofenac (VOLTAREN) 75 MG EC tablet Take 1 tablet (75 mg total) by mouth 2 (two) times daily. Take with food 36 tablet 2  . hydrocortisone (ANUSOL-HC) 2.5 % rectal cream Place 1 application rectally 2 (two) times daily. 30 g 3  . nitroGLYCERIN (NITROSTAT) 0.4 MG SL tablet Place 1 tablet (0.4 mg total) under the tongue every 5 (five) minutes as needed for chest pain. 25 tablet 5  . simvastatin (ZOCOR) 10 MG tablet Take 1 tablet (10 mg total) by mouth at bedtime. 90 tablet 3   No current facility-administered medications for this visit.    Allergies:  Review of patient's allergies indicates no known allergies.   Social History: The patient  reports  that he has quit smoking. His smoking use included Cigarettes. He quit smokeless tobacco use about 5 years ago. He reports that he does not drink alcohol or use illicit drugs.   ROS:  Please see the history of present illness. Otherwise, complete review of systems is positive for none.  All other systems are reviewed and negative.    Physical Exam: VS:  BP 142/82 mmHg  Pulse 52  Ht 5' 5"  (1.651 m)  Wt 166 lb (75.297 kg)  BMI 27.62 kg/m2  SpO2 96%, BMI Body mass index is 27.62 kg/(m^2).  Wt Readings from Last 3 Encounters:  04/02/14 166 lb (75.297 kg)  02/09/14 163 lb (73.936 kg)  09/12/13 165 lb (74.844 kg)     Patient appears comfortable.  HEENT: Conjunctiva and lids normal, oropharynx clear with moist mucosa.  Neck: Supple, no elevated JVP or carotid bruits, no thyromegaly.  Lungs: Clear to auscultation, nonlabored breathing at rest.  Cardiac: Regular rate and rhythm, no S3 or significant systolic murmur, no pericardial rub.  Abdomen: Soft, nontender, bowel sounds present, no guarding or rebound.  Extremities: No pitting edema, distal pulses 2+.     ECG: ECG is not ordered today.  Recent Labwork: 09/03/2013: ALT 13; AST 15     Component Value Date/Time   CHOL 167 09/03/2013 0828   TRIG 52 09/03/2013 0828   HDL 63 09/03/2013 0828   CHOLHDL 2.7 09/03/2013 0828  VLDL 10 09/03/2013 0828   LDLCALC 94 09/03/2013 0828    Other Studies Reviewed Today:  Exercise Cardiolite in September 2014 showed abnormal ST segment changes at maximum workload 13.4 METS without chest pain, burst of SVT and some PVCs, hypertensive response, perfusion imaging showing probable soft tissue attenuation in the anterior apex and inferior wall with no definite ischemia. LVEF was 53%. TID ratio was normal.  ASSESSMENT AND PLAN:  1. History of chest pain with overall reassuring ischemic workup as noted above. This has not been a progressive problem. Plan is to continue strategy of medical  therapy and observation. He continues on aspirin and statin.  2. Hyperlipidemia, arthralgias and muscle weakness noted on Zocor 20 mg daily. Plan is to reduce the dose to 10 mg daily for now. Follow-up FLP and LFT in 3 months.  Current medicines are reviewed at length with the patient today.  The patient has concerns regarding medicines. We discussed reducing dose of Zocor and perhaps changing to a different statin preparation if necessary.   Orders Placed This Encounter  Procedures  . Lipid Profile  . Hepatic function panel    Disposition: FU with me in 6 months.   Signed, Satira Sark, MD, [facility] 04/02/2014 10:49 AM    [facility]. [address], [place], [place] [address] Phone: [phone]; Fax: [phone]

## 2014-04-02 NOTE — Patient Instructions (Signed)
Your physician wants you to follow-up in: 6 months You will receive a reminder letter in the mail two months in advance. If you don't receive a letter, please call our office to schedule the follow-up appointment.   DECREASE Zocor to 10 mg daily   Please get lab work in 3 months FASTING LIPID,LFT'S      Thank you for choosing [facility] !

## 2014-07-08 LAB — LIPID PANEL
Cholesterol: 184 mg/dL (ref 0–200)
HDL: 59 mg/dL (ref >=40)
LDL Cholesterol: 111 mg/dL — ABNORMAL HIGH (ref 0–99)
TRIGLYCERIDES: 69 mg/dL (ref <150)
Total CHOL/HDL Ratio: 3.1 Ratio
VLDL: 14 mg/dL (ref 0–40)

## 2014-07-08 LAB — HEPATIC FUNCTION PANEL
ALBUMIN: 4.1 g/dL (ref 3.5–5.2)
ALT: 15 U/L (ref 0–53)
AST: 16 U/L (ref 0–37)
Alkaline Phosphatase: 38 U/L — ABNORMAL LOW (ref 39–117)
BILIRUBIN DIRECT: 0.1 mg/dL (ref 0.0–0.3)
BILIRUBIN INDIRECT: 0.3 mg/dL (ref 0.2–1.2)
Total Bilirubin: 0.4 mg/dL (ref 0.2–1.2)
Total Protein: 6.4 g/dL (ref 6.0–8.3)

## 2014-07-22 ENCOUNTER — Telehealth: Payer: Self-pay | Admitting: Family Medicine

## 2014-07-22 DIAGNOSIS — Z125 Encounter for screening for malignant neoplasm of prostate: Secondary | ICD-10-CM

## 2014-07-22 DIAGNOSIS — Z79899 Other long term (current) drug therapy: Secondary | ICD-10-CM

## 2014-07-22 NOTE — Telephone Encounter (Signed)
Patient had lipid and liver done by cardiology 07/08/14

## 2014-07-22 NOTE — Telephone Encounter (Signed)
psa m7

## 2014-07-22 NOTE — Telephone Encounter (Signed)
Pt has PE scheduled for 08/25/14, will he need any lab work, please advise

## 2014-07-23 NOTE — Telephone Encounter (Signed)
Blood work ordered in [employer]. Patient notified.

## 2014-08-25 ENCOUNTER — Encounter: Payer: Self-pay | Admitting: Family Medicine

## 2014-08-25 ENCOUNTER — Ambulatory Visit (INDEPENDENT_AMBULATORY_CARE_PROVIDER_SITE_OTHER): Payer: BLUE CROSS/BLUE SHIELD | Admitting: Family Medicine

## 2014-08-25 VITALS — BP 130/80 | Ht 66.5 in | Wt 161.0 lb

## 2014-08-25 DIAGNOSIS — Z0000 Encounter for general adult medical examination without abnormal findings: Secondary | ICD-10-CM | POA: Diagnosis not present
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
## 2014-08-25 NOTE — Patient Instructions (Signed)

## 2014-09-18 ENCOUNTER — Ambulatory Visit (INDEPENDENT_AMBULATORY_CARE_PROVIDER_SITE_OTHER): Payer: BLUE CROSS/BLUE SHIELD | Admitting: Cardiology

## 2014-09-18 ENCOUNTER — Encounter: Payer: Self-pay | Admitting: Cardiology

## 2014-09-18 VITALS — BP 112/64 | HR 53 | Ht 65.0 in | Wt 163.8 lb

## 2014-09-18 DIAGNOSIS — R001 Bradycardia, unspecified: Secondary | ICD-10-CM

## 2014-09-18 DIAGNOSIS — E785 Hyperlipidemia, unspecified: Secondary | ICD-10-CM | POA: Diagnosis not present

## 2014-09-18 DIAGNOSIS — Z136 Encounter for screening for cardiovascular disorders: Secondary | ICD-10-CM | POA: Diagnosis not present

## 2014-09-18 DIAGNOSIS — Z87898 Personal history of other specified conditions: Secondary | ICD-10-CM

## 2014-09-18 NOTE — Patient Instructions (Signed)
Your physician wants you to follow-up in: 6 months with Dr Ferne Reus will receive a reminder letter in the mail two months in advance. If you don't receive a letter, please call our office to schedule the follow-up appointment.    Get lab work FASTING just before next visit: lipid,LFT'S     Your physician recommends that you continue on your current medications as directed. Please refer to the Current Medication list given to you today.      Thank you for choosing [facility] !

## 2014-09-18 NOTE — Progress Notes (Signed)
Cardiology Office Note  Date: 09/18/2014   ID: Miguel Morrow, Miguel Morrow [birth year], MRN [id number]  PCP: Mickie Hillier, MD  Primary Cardiologist: Rozann Lesches, MD   Chief Complaint  Patient presents with  . Follow-up chest    History of Present Illness: Miguel Morrow is a 64 y.o. male last seen in March. He comes in for a follow-up visit today without any significant angina symptoms or nitroglycerin use. He continues to work full time at [employer], may be retiring in a year.  At the last visit we reduced Zocor to 10 mg daily with complaints of arthralgias and muscle weakness. His LDL went up to 111 from 94. We discussed either intensifying his dietary restrictions or changing to a different preparation. For now we elected to pursue the former.  ECG today shows sinus bradycardia with LVH.   Past Medical History  Diagnosis Date  . Nephrolithiasis 1984  . Bradycardia     Current Outpatient Prescriptions  Medication Sig Dispense Refill  . aspirin 81 MG tablet Take 81 mg by mouth daily.    . hydrocortisone (ANUSOL-HC) 2.5 % rectal cream Place 1 application rectally 2 (two) times daily. 30 g 3  . nitroGLYCERIN (NITROSTAT) 0.4 MG SL tablet Place 1 tablet (0.4 mg total) under the tongue every 5 (five) minutes as needed for chest pain. 25 tablet 5  . simvastatin (ZOCOR) 10 MG tablet Take 1 tablet (10 mg total) by mouth at bedtime. 90 tablet 3   No current facility-administered medications for this visit.    Allergies:  Review of patient's allergies indicates no known allergies.   Social History: The patient  reports that he has quit smoking. His smoking use included Cigarettes. He quit smokeless tobacco use about 6 years ago. He reports that he does not drink alcohol or use illicit drugs.   ROS:  Please see the history of present illness. Otherwise, complete review of systems is positive for none.  All other systems are reviewed and negative.   Physical Exam: VS:  BP 112/64 mmHg   Pulse 53  Ht 5' 5"  (1.651 m)  Wt 163 lb 12.8 oz (74.299 kg)  BMI 27.26 kg/m2  SpO2 95%, BMI Body mass index is 27.26 kg/(m^2).  Wt Readings from Last 3 Encounters:  09/18/14 163 lb 12.8 oz (74.299 kg)  08/25/14 161 lb (73.029 kg)  04/02/14 166 lb (75.297 kg)     Patient appears comfortable.  HEENT: Conjunctiva and lids normal, oropharynx clear with moist mucosa.  Neck: Supple, no elevated JVP or carotid bruits, no thyromegaly.  Lungs: Clear to auscultation, nonlabored breathing at rest.  Cardiac: Regular rate and rhythm, no S3 or significant systolic murmur, no pericardial rub.  Abdomen: Soft, nontender, bowel sounds present, no guarding or rebound.  Extremities: No pitting edema, distal pulses 2+.    ECG: ECG is ordered today.   Recent Labwork: 07/07/2014: ALT 15; AST 16     Component Value Date/Time   CHOL 184 07/07/2014 0756   TRIG 69 07/07/2014 0756   HDL 59 07/07/2014 0756   CHOLHDL 3.1 07/07/2014 0756   VLDL 14 07/07/2014 0756   LDLCALC 111* 07/07/2014 0756    Other Studies Reviewed Today:  Exercise Cardiolite in September 2014 showed abnormal ST segment changes at maximum workload 13.4 METS without chest pain, burst of SVT and some PVCs, hypertensive response, perfusion imaging showing probable soft tissue attenuation in the anterior apex and inferior wall with no definite ischemia. LVEF was 53%.  TID ratio was normal.  Assessment and Plan:  1. History of chest pain, no recurrence with ischemic workup as outlined above. We continue observation and risk factor modification with medical therapy.  2. Bradycardia, asymptomatic.  Current medicines were reviewed with the patient today.   Orders Placed This Encounter  Procedures  . Lipid Profile  . Hepatic function panel  . EKG 12-Lead    Disposition: FU with me in 6 months.   Signed, Satira Sark, MD, [facility] 09/18/2014 2:15 PM    [facility] at [place],  [place], [place] [address] Phone: [phone]; Fax: [phone]

## 2014-09-28 ENCOUNTER — Other Ambulatory Visit: Payer: Self-pay | Admitting: *Deleted

## 2014-09-28 MED ORDER — SIMVASTATIN 10 MG PO TABS: 10.0000 mg | ORAL_TABLET | Freq: Every day | ORAL | Status: DC

## 2014-11-21 ENCOUNTER — Other Ambulatory Visit: Payer: Self-pay | Admitting: Cardiology

## 2015-03-11 ENCOUNTER — Other Ambulatory Visit: Payer: Self-pay | Admitting: Cardiology

## 2015-03-11 LAB — LIPID PANEL
Cholesterol: 193 mg/dL (ref 125–200)
HDL: 71 mg/dL (ref >=40)
LDL Cholesterol: 109 mg/dL (ref <130)
TRIGLYCERIDES: 66 mg/dL (ref <150)
Total CHOL/HDL Ratio: 2.7 Ratio (ref <=5.0)
VLDL: 13 mg/dL (ref <30)

## 2015-03-11 LAB — HEPATIC FUNCTION PANEL
ALK PHOS: 37 U/L — AB (ref 40–115)
ALT: 14 U/L (ref 9–46)
AST: 16 U/L (ref 10–35)
Albumin: 4.2 g/dL (ref 3.6–5.1)
BILIRUBIN INDIRECT: 0.4 mg/dL (ref 0.2–1.2)
BILIRUBIN TOTAL: 0.5 mg/dL (ref 0.2–1.2)
Bilirubin, Direct: 0.1 mg/dL (ref <=0.2)
Total Protein: 6.8 g/dL (ref 6.1–8.1)

## 2015-03-17 ENCOUNTER — Encounter: Payer: Self-pay | Admitting: Cardiology

## 2015-03-17 ENCOUNTER — Ambulatory Visit (INDEPENDENT_AMBULATORY_CARE_PROVIDER_SITE_OTHER): Payer: BLUE CROSS/BLUE SHIELD | Admitting: Cardiology

## 2015-03-17 VITALS — BP 138/82 | HR 55 | Ht 65.0 in | Wt 164.0 lb

## 2015-03-17 DIAGNOSIS — E785 Hyperlipidemia, unspecified: Secondary | ICD-10-CM

## 2015-03-17 DIAGNOSIS — Z87898 Personal history of other specified conditions: Secondary | ICD-10-CM

## 2015-03-17 NOTE — Patient Instructions (Signed)
Your physician wants you to follow-up in: 1 year with Dr Ferne Reus will receive a reminder letter in the mail two months in advance. If you don't receive a letter, please call our office to schedule the follow-up appointment.    HOLD Zocor for 2 weeks and call us back and give [place] update regarding symptoms     Take all other meds as usual    If you need a refill on your cardiac medications before your next appointment, please call your pharmacy.    Thank you for choosing [facility] !

## 2015-03-17 NOTE — Progress Notes (Signed)
Cardiology Office Note  Date: 03/17/2015   ID: Huntley, Knoop [birth year], MRN [id number]  PCP: Mickie Hillier, MD  Primary Cardiologist: Rozann Lesches, MD   Chief Complaint  Patient presents with  . Cardiac follow-up    History of Present Illness: Miguel Morrow is a 65 y.o. male last seen in March 2016. He presents for a routine follow-up visit. Since last evaluation he does not report any exertional chest pain. He continues to work at [employer]. He does not endorse any decline in overall functional capacity. NYHA class 1-2 dyspnea with typical activities.  We discussed his medications which are outlined below. We did cut his Zocor back to 10 mg at the last visit, he still reports problems with intermittent muscle soreness and joint achiness. Some of this sounds more musculoskeletal and due to physical activity. Hard to exclude side effect however.  We went over his recent lab work, LDL is 109 at this time, HDL 71.  Past Medical History  Diagnosis Date  . Nephrolithiasis 1984  . Bradycardia     Current Outpatient Prescriptions  Medication Sig Dispense Refill  . aspirin 81 MG tablet Take 81 mg by mouth daily.    . hydrocortisone (ANUSOL-HC) 2.5 % rectal cream Place 1 application rectally 2 (two) times daily. 30 g 3  . nitroGLYCERIN (NITROSTAT) 0.4 MG SL tablet Place 1 tablet (0.4 mg total) under the tongue every 5 (five) minutes as needed for chest pain. 25 tablet 5  . simvastatin (ZOCOR) 10 MG tablet Take 1 tablet by mouth at  bedtime (Patient taking differently: Take 1 tablet by mouth at  bedtime  03/17/15 HOLD for 2 WEEKS per SM) 90 tablet 3   No current facility-administered medications for this visit.   Allergies:  Review of patient's allergies indicates no known allergies.   Social History: The patient  reports that he quit smoking about 7 years ago. His smoking use included Cigarettes. He quit smokeless tobacco use about 6 years ago. He reports that he does not drink  alcohol or use illicit drugs.   ROS:  Please see the history of present illness. Otherwise, complete review of systems is positive for intermittent joint pain and muscle achiness.  All other systems are reviewed and negative.   Physical Exam: VS:  BP 138/82 mmHg  Pulse 55  Ht 5' 5"  (1.651 m)  Wt 164 lb (74.39 kg)  BMI 27.29 kg/m2  SpO2 97%, BMI Body mass index is 27.29 kg/(m^2).  Wt Readings from Last 3 Encounters:  03/17/15 164 lb (74.39 kg)  09/18/14 163 lb 12.8 oz (74.299 kg)  08/25/14 161 lb (73.029 kg)    Patient appears comfortable.  HEENT: Conjunctiva and lids normal, oropharynx clear with moist mucosa.  Neck: Supple, no elevated JVP or carotid bruits, no thyromegaly.  Lungs: Clear to auscultation, nonlabored breathing at rest.  Cardiac: Regular rate and rhythm, no S3 or significant systolic murmur, no pericardial rub.  Abdomen: Soft, nontender, bowel sounds present, no guarding or rebound.  Extremities: No pitting edema, distal pulses 2+.   ECG: I personally reviewed the prior tracing from 09/18/2014 which showed sinus bradycardia with increased voltage.  Recent Labwork: 03/11/2015: ALT 14; AST 16     Component Value Date/Time   CHOL 193 03/11/2015 0757   TRIG 66 03/11/2015 0757   HDL 71 03/11/2015 0757   CHOLHDL 2.7 03/11/2015 0757   VLDL 13 03/11/2015 0757   LDLCALC 109 03/11/2015 0757    Other  Studies Reviewed Today:  Exercise Cardiolite September 2014: Abnormal ST segment changes at maximum workload 13.4 METS without chest pain, burst of SVT and some PVCs, hypertensive response, perfusion imaging showing probable soft tissue attenuation in the anterior apex and inferior wall with no definite ischemia. LVEF was 53%. TID ratio was normal.  Assessment and Plan:  1. History of chest pain without recurrence. Last ischemic testing was in 2014 as outlined above. He has had no major decline in functional capacity, and recommendation is to continue with  observation and risk factor modification. 2 new on aspirin and statin therapy (see below). Has as needed nitroglycerin.  2. Hyperlipidemia, recent LDL 109. Since still concerned about possible statin side effects as discussed above, we will have him hold Zocor completely for a few weeks to see if the symptoms improve. If so, will consider switch to a different preparation such as Crestor. Otherwise he will resume Zocor.  Current medicines were reviewed with the patient today.  Disposition: FU with me in 1 year.   Signed, Satira Sark, MD, [facility] 03/17/2015 10:13 AM    [facility] at [facility] [address] [place], [place] [address] Phone: [phone]; Fax: [phone]

## 2015-08-24 ENCOUNTER — Telehealth: Payer: Self-pay | Admitting: Family Medicine

## 2015-08-24 DIAGNOSIS — Z79899 Other long term (current) drug therapy: Secondary | ICD-10-CM

## 2015-08-24 DIAGNOSIS — Z139 Encounter for screening, unspecified: Secondary | ICD-10-CM

## 2015-08-24 DIAGNOSIS — E785 Hyperlipidemia, unspecified: Secondary | ICD-10-CM

## 2015-08-24 NOTE — Telephone Encounter (Signed)
Patient has an appointment on Monday coming up for his physical.  He wants to know if there is any way we can order his lab work so he can have his BW done this morning, because today is his only day off.

## 2015-08-24 NOTE — Telephone Encounter (Signed)
Spoke with patient and informed him per Dr.Steve Elgin have been ordered. Needs to be fasting prior to having labs drawn. Patient verbalized understanding.

## 2015-08-24 NOTE — Telephone Encounter (Signed)
Patient states if we can get the blood work in today, he will go Saturday before his appointment.

## 2015-08-24 NOTE — Telephone Encounter (Signed)
Lip liv m7 psa

## 2015-08-24 NOTE — Telephone Encounter (Signed)
Left message return call 08/24/15 (labs ordered)

## 2015-08-26 DIAGNOSIS — Z79899 Other long term (current) drug therapy: Secondary | ICD-10-CM | POA: Diagnosis not present

## 2015-08-26 DIAGNOSIS — Z139 Encounter for screening, unspecified: Secondary | ICD-10-CM | POA: Diagnosis not present

## 2015-08-26 DIAGNOSIS — E785 Hyperlipidemia, unspecified: Secondary | ICD-10-CM | POA: Diagnosis not present

## 2015-08-27 LAB — PSA: Prostate Specific Ag, Serum: 0.8 ng/mL (ref 0.0–4.0)

## 2015-08-27 LAB — LIPID PANEL
CHOL/HDL RATIO: 2.9 ratio (ref 0.0–5.0)
Cholesterol, Total: 189 mg/dL (ref 100–199)
HDL: 66 mg/dL (ref >39)
LDL CALC: 111 mg/dL — AB (ref 0–99)
TRIGLYCERIDES: 61 mg/dL (ref 0–149)
VLDL CHOLESTEROL CAL: 12 mg/dL (ref 5–40)

## 2015-08-27 LAB — BASIC METABOLIC PANEL
BUN / CREAT RATIO: 25 — AB (ref 10–24)
BUN: 17 mg/dL (ref 8–27)
CO2: 23 mmol/L (ref 18–29)
CREATININE: 0.69 mg/dL — AB (ref 0.76–1.27)
Calcium: 9.1 mg/dL (ref 8.6–10.2)
Chloride: 103 mmol/L (ref 96–106)
GFR calc Af Amer: 116 mL/min/{1.73_m2} (ref >59)
GFR, EST NON AFRICAN AMERICAN: 100 mL/min/{1.73_m2} (ref >59)
GLUCOSE: 69 mg/dL (ref 65–99)
POTASSIUM: 4.2 mmol/L (ref 3.5–5.2)
SODIUM: 144 mmol/L (ref 134–144)

## 2015-08-27 LAB — HEPATIC FUNCTION PANEL
ALBUMIN: 4.2 g/dL (ref 3.6–4.8)
ALK PHOS: 40 IU/L (ref 39–117)
ALT: 13 IU/L (ref 0–44)
AST: 15 IU/L (ref 0–40)
BILIRUBIN, DIRECT: 0.12 mg/dL (ref 0.00–0.40)
Bilirubin Total: 0.4 mg/dL (ref 0.0–1.2)
TOTAL PROTEIN: 6.6 g/dL (ref 6.0–8.5)

## 2015-08-30 ENCOUNTER — Ambulatory Visit (INDEPENDENT_AMBULATORY_CARE_PROVIDER_SITE_OTHER): Payer: BLUE CROSS/BLUE SHIELD | Admitting: Family Medicine

## 2015-08-30 ENCOUNTER — Encounter: Payer: Self-pay | Admitting: Family Medicine

## 2015-08-30 VITALS — BP 122/74 | Ht 66.0 in | Wt 158.0 lb

## 2015-08-30 DIAGNOSIS — Z0000 Encounter for general adult medical examination without abnormal findings: Secondary | ICD-10-CM

## 2015-08-30 NOTE — Patient Instructions (Signed)
Results for orders placed or performed in visit on 08/24/15  Lipid panel  Result Value Ref Range   Cholesterol, Total 189 100 - 199 mg/dL   Triglycerides 61 0 - 149 mg/dL   HDL 66 >39 mg/dL   VLDL Cholesterol Cal 12 5 - 40 mg/dL   LDL Calculated 111 (H) 0 - 99 mg/dL   Chol/HDL Ratio 2.9 0.0 - 5.0 ratio units  Hepatic function panel  Result Value Ref Range   Total Protein 6.6 6.0 - 8.5 g/dL   Albumin 4.2 3.6 - 4.8 g/dL   Bilirubin Total 0.4 0.0 - 1.2 mg/dL   Bilirubin, Direct 0.12 0.00 - 0.40 mg/dL   Alkaline Phosphatase 40 39 - 117 IU/L   AST 15 0 - 40 IU/L   ALT 13 0 - 44 IU/L  Basic metabolic panel  Result Value Ref Range   Glucose 69 65 - 99 mg/dL   BUN 17 8 - 27 mg/dL   Creatinine, Ser 0.69 (L) 0.76 - 1.27 mg/dL   GFR calc non Af Amer 100 >59 mL/min/1.73   GFR calc Af Amer 116 >59 mL/min/1.73   BUN/Creatinine Ratio 25 (H) 10 - 24   Sodium 144 134 - 144 mmol/L   Potassium 4.2 3.5 - 5.2 mmol/L   Chloride 103 96 - 106 mmol/L   CO2 23 18 - 29 mmol/L   Calcium 9.1 8.6 - 10.2 mg/dL  PSA  Result Value Ref Range   Prostate Specific Ag, Serum 0.8 0.0 - 4.0 ng/mL
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
## 2015-09-03 ENCOUNTER — Encounter: Payer: Self-pay | Admitting: Internal Medicine

## 2015-12-03 ENCOUNTER — Ambulatory Visit (INDEPENDENT_AMBULATORY_CARE_PROVIDER_SITE_OTHER): Payer: BLUE CROSS/BLUE SHIELD

## 2015-12-03 DIAGNOSIS — Z23 Encounter for immunization: Secondary | ICD-10-CM | POA: Diagnosis not present

## 2016-10-31 ENCOUNTER — Ambulatory Visit (INDEPENDENT_AMBULATORY_CARE_PROVIDER_SITE_OTHER): Payer: BLUE CROSS/BLUE SHIELD | Admitting: Family Medicine

## 2016-10-31 ENCOUNTER — Telehealth: Payer: Self-pay | Admitting: Family Medicine

## 2016-10-31 ENCOUNTER — Encounter: Payer: Self-pay | Admitting: Family Medicine

## 2016-10-31 VITALS — BP 178/82 | HR 61 | Temp 98.2°F | Ht 66.0 in | Wt 162.0 lb

## 2016-10-31 DIAGNOSIS — Z79899 Other long term (current) drug therapy: Secondary | ICD-10-CM | POA: Diagnosis not present

## 2016-10-31 DIAGNOSIS — J329 Chronic sinusitis, unspecified: Secondary | ICD-10-CM

## 2016-10-31 DIAGNOSIS — E782 Mixed hyperlipidemia: Secondary | ICD-10-CM

## 2016-10-31 DIAGNOSIS — Z125 Encounter for screening for malignant neoplasm of prostate: Secondary | ICD-10-CM

## 2016-10-31 DIAGNOSIS — J310 Chronic rhinitis: Secondary | ICD-10-CM

## 2016-10-31 DIAGNOSIS — I10 Essential (primary) hypertension: Secondary | ICD-10-CM | POA: Diagnosis not present

## 2016-10-31 DIAGNOSIS — R42 Dizziness and giddiness: Secondary | ICD-10-CM | POA: Diagnosis not present

## 2016-10-31 MED ORDER — MECLIZINE HCL 25 MG PO TABS: 25.0000 mg | ORAL_TABLET | Freq: Three times a day (TID) | ORAL | 0 refills | Status: DC | PRN

## 2016-10-31 MED ORDER — AMOXICILLIN 500 MG PO CAPS: 500.0000 mg | ORAL_CAPSULE | Freq: Three times a day (TID) | ORAL | 0 refills | Status: DC

## 2016-10-31 NOTE — Telephone Encounter (Signed)
Yes. Coming in at 3 today to see dr Richardson Landry

## 2016-10-31 NOTE — Telephone Encounter (Signed)
Patient has an appointment with Dr. Richardson Landry on 11/21/16.  He is requesting order for labs.

## 2016-10-31 NOTE — Progress Notes (Signed)
   Subjective:    Patient ID: Miguel Morrow, male    DOB: 1950-07-21, 66 y.o.   MRN: [id number]  Dizziness  This is a new problem. The current episode started in the past 7 days.  Patient states he woke up last Wednesday with the room spinning. He took Dramamine and it seems to help some. He has had some vomiting with the dizziness. He reports Wednesday and Thursday he had left ear pain/dull. He is experiencing dull headaches as well.  Orthostatic Bps done.  One wk ago developed stumling and wobbling, and felt dizzy and unsteady  Pt had a headache and voiting and vomiting  Took dramamine prn, helped some  The following day felt better Friday a lot better   Lifescape e of position cause s dizziness to worsen  No fam hx of high blood peressure  Some stufines nasal       Sat dizzy but not a bad  Review of Systems  Neurological: Positive for dizziness.       Objective:   Physical Exam Alert and oriented, vitals reviewed and stable, NAD ENT-TM's and ext canals WNL bilat via otoscopic exam Soft palate, tonsils and post pharynx WNL via oropharyngeal exam Neck-symmetric, no masses; thyroid nonpalpable and nontender Pulmonary-no tachypnea or accessory muscle use; Clear without wheezes via auscultation Card--no abnrml murmurs, rhythm reg and rate WNL Carotid pulses symmetric, without bruits Neuro exam negative cerebellar findings. Finger to nose within normal limits. Fine motor control normal strength intact. No focal neurologil deficits. Blood pressure still elevated on repeat       Assessment & Plan:  Impression hypertension. Long discussion held. Has been on medication past I think time to resume the patient would like to wait and follow-up at physical  #2 vertigo with probable element of sinusitis discussed doubt more serious etiology. Discussed warning signs discussed Antivert 3 times a day when necessary. Antibiotics prescribed

## 2016-10-31 NOTE — Telephone Encounter (Signed)
Is this same pt we are seeing this afternoon?, if so will do then

## 2016-10-31 NOTE — Telephone Encounter (Signed)
Patient had Lipid, Liver, met 7 and PSA 07/2015

## 2016-11-15 DIAGNOSIS — Z125 Encounter for screening for malignant neoplasm of prostate: Secondary | ICD-10-CM | POA: Diagnosis not present

## 2016-11-15 DIAGNOSIS — Z79899 Other long term (current) drug therapy: Secondary | ICD-10-CM | POA: Diagnosis not present

## 2016-11-15 DIAGNOSIS — E782 Mixed hyperlipidemia: Secondary | ICD-10-CM | POA: Diagnosis not present

## 2016-11-16 LAB — LIPID PANEL
CHOLESTEROL TOTAL: 212 mg/dL — AB (ref 100–199)
Chol/HDL Ratio: 3.1 ratio (ref 0.0–5.0)
HDL: 68 mg/dL (ref >39)
LDL Calculated: 131 mg/dL — ABNORMAL HIGH (ref 0–99)
TRIGLYCERIDES: 64 mg/dL (ref 0–149)
VLDL Cholesterol Cal: 13 mg/dL (ref 5–40)

## 2016-11-16 LAB — BASIC METABOLIC PANEL
BUN / CREAT RATIO: 17 (ref 10–24)
BUN: 13 mg/dL (ref 8–27)
CALCIUM: 9.2 mg/dL (ref 8.6–10.2)
CO2: 24 mmol/L (ref 20–29)
Chloride: 105 mmol/L (ref 96–106)
Creatinine, Ser: 0.76 mg/dL (ref 0.76–1.27)
GFR, EST AFRICAN AMERICAN: 110 mL/min/{1.73_m2} (ref >59)
GFR, EST NON AFRICAN AMERICAN: 95 mL/min/{1.73_m2} (ref >59)
Glucose: 92 mg/dL (ref 65–99)
POTASSIUM: 4.4 mmol/L (ref 3.5–5.2)
Sodium: 142 mmol/L (ref 134–144)

## 2016-11-16 LAB — PSA: Prostate Specific Ag, Serum: 1.6 ng/mL (ref 0.0–4.0)

## 2016-11-16 LAB — HEPATIC FUNCTION PANEL
ALT: 15 IU/L (ref 0–44)
AST: 19 IU/L (ref 0–40)
Albumin: 4.4 g/dL (ref 3.6–4.8)
Alkaline Phosphatase: 42 IU/L (ref 39–117)
BILIRUBIN TOTAL: 0.5 mg/dL (ref 0.0–1.2)
BILIRUBIN, DIRECT: 0.12 mg/dL (ref 0.00–0.40)
Total Protein: 6.9 g/dL (ref 6.0–8.5)

## 2016-11-21 ENCOUNTER — Ambulatory Visit (INDEPENDENT_AMBULATORY_CARE_PROVIDER_SITE_OTHER): Payer: BLUE CROSS/BLUE SHIELD | Admitting: Family Medicine

## 2016-11-21 ENCOUNTER — Encounter: Payer: Self-pay | Admitting: Family Medicine

## 2016-11-21 VITALS — BP 128/68 | Ht 66.0 in | Wt 165.0 lb

## 2016-11-21 DIAGNOSIS — Z23 Encounter for immunization: Secondary | ICD-10-CM

## 2016-11-21 DIAGNOSIS — Z0000 Encounter for general adult medical examination without abnormal findings: Secondary | ICD-10-CM | POA: Diagnosis not present

## 2016-11-21 NOTE — Progress Notes (Signed)
Subjective:    Patient ID: Miguel Morrow, male    DOB: March 11, 1950, 66 y.o.   MRN: [id number]  HPI The patient comes in today for a wellness visit.    A review of their health history was completed.  A review of medications was also completed.  Any needed refills; None  Eating habits: Good eats too much  Falls/  MVA accidents in past few months: None  Regular exercise: Yes  Specialist pt sees on regular basis: None  Preventative health issues were discussed.   Additional concerns: None f u bp meds good  Results for orders placed or performed in visit on 10/31/16  Lipid panel  Result Value Ref Range   Cholesterol, Total 212 (H) 100 - 199 mg/dL   Triglycerides 64 0 - 149 mg/dL   HDL 68 >39 mg/dL   VLDL Cholesterol Cal 13 5 - 40 mg/dL   LDL Calculated 131 (H) 0 - 99 mg/dL   Chol/HDL Ratio 3.1 0.0 - 5.0 ratio  Hepatic function panel  Result Value Ref Range   Total Protein 6.9 6.0 - 8.5 g/dL   Albumin 4.4 3.6 - 4.8 g/dL   Bilirubin Total 0.5 0.0 - 1.2 mg/dL   Bilirubin, Direct 0.12 0.00 - 0.40 mg/dL   Alkaline Phosphatase 42 39 - 117 IU/L   AST 19 0 - 40 IU/L   ALT 15 0 - 44 IU/L  Basic metabolic panel  Result Value Ref Range   Glucose 92 65 - 99 mg/dL   BUN 13 8 - 27 mg/dL   Creatinine, Ser 0.76 0.76 - 1.27 mg/dL   GFR calc non Af Amer 95 >59 mL/min/1.73   GFR calc Af Amer 110 >59 mL/min/1.73   BUN/Creatinine Ratio 17 10 - 24   Sodium 142 134 - 144 mmol/L   Potassium 4.4 3.5 - 5.2 mmol/L   Chloride 105 96 - 106 mmol/L   CO2 24 20 - 29 mmol/L   Calcium 9.2 8.6 - 10.2 mg/dL  PSA  Result Value Ref Range   Prostate Specific Ag, Serum 1.6 0.0 - 4.0 ng/mL   Pt notes some room for iprovement, has treadmell but not using reg  Watching salt and fatty fods, using red meats some but not a lot   Pt was waiting   Due colonoscopy now, due a f u now .  [facility] been [facility] 21 yrs,and six yrs of [facility]      Review of Systems  Constitutional: Negative for  activity change, appetite change and fever.  HENT: Negative for congestion and rhinorrhea.   Eyes: Negative for discharge.  Respiratory: Negative for cough and wheezing.   Cardiovascular: Negative for chest pain.  Gastrointestinal: Negative for abdominal pain, blood in stool and vomiting.  Genitourinary: Negative for difficulty urinating and frequency.  Musculoskeletal: Negative for neck pain.  Skin: Negative for rash.  Allergic/Immunologic: Negative for environmental allergies and food allergies.  Neurological: Negative for weakness and headaches.  Psychiatric/Behavioral: Negative for agitation.  All other systems reviewed and are negative.      Objective:   Physical Exam  Constitutional: He appears well-developed and well-nourished.  HENT:  Head: Normocephalic and atraumatic.  Right Ear: External ear normal.  Left Ear: External ear normal.  Nose: Nose normal.  Mouth/Throat: Oropharynx is clear and moist.  Eyes: Pupils are equal, round, and reactive to light. EOM are normal.  Neck: Normal range of motion. Neck supple. No thyromegaly present.  Cardiovascular: Normal rate, regular rhythm  and normal heart sounds.   No murmur heard. Pulmonary/Chest: Effort normal and breath sounds normal. No respiratory distress. He has no wheezes.  Abdominal: Soft. Bowel sounds are normal. He exhibits no distension and no mass. There is no tenderness.  Genitourinary: Penis normal.  Musculoskeletal: Normal range of motion. He exhibits no edema.  Lymphadenopathy:    He has no cervical adenopathy.  Neurological: He is alert. He exhibits normal muscle tone.  Skin: Skin is warm and dry. No erythema.  Psychiatric: He has a normal mood and affect. His behavior is normal. Judgment normal.          Assessment & Plan:  Impression wellness exam diet discussed. Exercise discussed. Vaccines discussed. Flu shot administered. Up-to-date on colonoscopy.  Elevated blood pressure clinically result this  time  Blood work discussed.

## 2016-11-21 NOTE — Patient Instructions (Signed)
Results for orders placed or performed in visit on 10/31/16  Lipid panel  Result Value Ref Range   Cholesterol, Total 212 (H) 100 - 199 mg/dL   Triglycerides 64 0 - 149 mg/dL   HDL 68 >39 mg/dL   VLDL Cholesterol Cal 13 5 - 40 mg/dL   LDL Calculated 131 (H) 0 - 99 mg/dL   Chol/HDL Ratio 3.1 0.0 - 5.0 ratio  Hepatic function panel  Result Value Ref Range   Total Protein 6.9 6.0 - 8.5 g/dL   Albumin 4.4 3.6 - 4.8 g/dL   Bilirubin Total 0.5 0.0 - 1.2 mg/dL   Bilirubin, Direct 0.12 0.00 - 0.40 mg/dL   Alkaline Phosphatase 42 39 - 117 IU/L   AST 19 0 - 40 IU/L   ALT 15 0 - 44 IU/L  Basic metabolic panel  Result Value Ref Range   Glucose 92 65 - 99 mg/dL   BUN 13 8 - 27 mg/dL   Creatinine, Ser 0.76 0.76 - 1.27 mg/dL   GFR calc non Af Amer 95 >59 mL/min/1.73   GFR calc Af Amer 110 >59 mL/min/1.73   BUN/Creatinine Ratio 17 10 - 24   Sodium 142 134 - 144 mmol/L   Potassium 4.4 3.5 - 5.2 mmol/L   Chloride 105 96 - 106 mmol/L   CO2 24 20 - 29 mmol/L   Calcium 9.2 8.6 - 10.2 mg/dL  PSA  Result Value Ref Range   Prostate Specific Ag, Serum 1.6 0.0 - 4.0 ng/mL

## 2017-10-23 ENCOUNTER — Telehealth: Payer: Self-pay | Admitting: Family Medicine

## 2017-10-23 DIAGNOSIS — I10 Essential (primary) hypertension: Secondary | ICD-10-CM

## 2017-10-23 DIAGNOSIS — Z125 Encounter for screening for malignant neoplasm of prostate: Secondary | ICD-10-CM

## 2017-10-23 DIAGNOSIS — Z79899 Other long term (current) drug therapy: Secondary | ICD-10-CM

## 2017-10-23 DIAGNOSIS — E782 Mixed hyperlipidemia: Secondary | ICD-10-CM

## 2017-10-23 NOTE — Telephone Encounter (Signed)
Same plus cbc

## 2017-10-23 NOTE — Telephone Encounter (Signed)
Orders put in. Pt notified.

## 2017-10-23 NOTE — Telephone Encounter (Signed)
Last labs PSA, BMP, Hepatic and lipid on 11/15/16

## 2017-10-23 NOTE — Telephone Encounter (Signed)
Patient has appointment for physical 10/29 and needing lab work done

## 2017-11-20 LAB — HEPATIC FUNCTION PANEL
ALK PHOS: 48 IU/L (ref 39–117)
ALT: 26 IU/L (ref 0–44)
AST: 19 IU/L (ref 0–40)
Albumin: 4.3 g/dL (ref 3.6–4.8)
BILIRUBIN, DIRECT: 0.09 mg/dL (ref 0.00–0.40)
Bilirubin Total: 0.4 mg/dL (ref 0.0–1.2)
Total Protein: 6.8 g/dL (ref 6.0–8.5)

## 2017-11-20 LAB — BASIC METABOLIC PANEL
BUN / CREAT RATIO: 17 (ref 10–24)
BUN: 14 mg/dL (ref 8–27)
CO2: 21 mmol/L (ref 20–29)
CREATININE: 0.81 mg/dL (ref 0.76–1.27)
Calcium: 9.4 mg/dL (ref 8.6–10.2)
Chloride: 106 mmol/L (ref 96–106)
GFR, EST AFRICAN AMERICAN: 106 mL/min/{1.73_m2} (ref >59)
GFR, EST NON AFRICAN AMERICAN: 92 mL/min/{1.73_m2} (ref >59)
Glucose: 83 mg/dL (ref 65–99)
POTASSIUM: 4.7 mmol/L (ref 3.5–5.2)
SODIUM: 142 mmol/L (ref 134–144)

## 2017-11-20 LAB — CBC WITH DIFFERENTIAL/PLATELET
Basophils Absolute: 0.1 10*3/uL (ref 0.0–0.2)
Basos: 1 %
EOS (ABSOLUTE): 0.2 10*3/uL (ref 0.0–0.4)
Eos: 2 %
Hematocrit: 43.3 % (ref 37.5–51.0)
Hemoglobin: 15.3 g/dL (ref 13.0–17.7)
IMMATURE GRANULOCYTES: 0 %
Immature Grans (Abs): 0 10*3/uL (ref 0.0–0.1)
LYMPHS ABS: 2.3 10*3/uL (ref 0.7–3.1)
Lymphs: 27 %
MCH: 31.1 pg (ref 26.6–33.0)
MCHC: 35.3 g/dL (ref 31.5–35.7)
MCV: 88 fL (ref 79–97)
MONOS ABS: 0.7 10*3/uL (ref 0.1–0.9)
Monocytes: 8 %
NEUTROS ABS: 5.3 10*3/uL (ref 1.4–7.0)
Neutrophils: 62 %
PLATELETS: 242 10*3/uL (ref 150–450)
RBC: 4.92 x10E6/uL (ref 4.14–5.80)
RDW: 13.3 % (ref 12.3–15.4)
WBC: 8.6 10*3/uL (ref 3.4–10.8)

## 2017-11-20 LAB — LIPID PANEL
CHOLESTEROL TOTAL: 213 mg/dL — AB (ref 100–199)
Chol/HDL Ratio: 4 ratio (ref 0.0–5.0)
HDL: 53 mg/dL (ref >39)
LDL Calculated: 140 mg/dL — ABNORMAL HIGH (ref 0–99)
TRIGLYCERIDES: 102 mg/dL (ref 0–149)
VLDL Cholesterol Cal: 20 mg/dL (ref 5–40)

## 2017-11-20 LAB — PSA: Prostate Specific Ag, Serum: 1 ng/mL (ref 0.0–4.0)

## 2017-11-27 ENCOUNTER — Encounter: Payer: Self-pay | Admitting: Family Medicine

## 2017-11-27 ENCOUNTER — Ambulatory Visit (INDEPENDENT_AMBULATORY_CARE_PROVIDER_SITE_OTHER): Payer: Medicare Other | Admitting: Family Medicine

## 2017-11-27 VITALS — BP 138/80 | Ht 66.0 in | Wt 177.0 lb

## 2017-11-27 DIAGNOSIS — Z0000 Encounter for general adult medical examination without abnormal findings: Secondary | ICD-10-CM

## 2017-11-27 DIAGNOSIS — Z23 Encounter for immunization: Secondary | ICD-10-CM | POA: Diagnosis not present

## 2017-11-27 DIAGNOSIS — G629 Polyneuropathy, unspecified: Secondary | ICD-10-CM

## 2017-11-27 DIAGNOSIS — Z1211 Encounter for screening for malignant neoplasm of colon: Secondary | ICD-10-CM

## 2017-11-27 NOTE — Progress Notes (Signed)
Subjective:    Patient ID: Miguel Morrow, male    DOB: 1950/03/06, 67 y.o.   MRN: [id number]  HPI  AWV- Annual Wellness Visit  The patient was seen for their annual wellness visit. The patient's past medical history, surgical history, and family history were reviewed. Pertinent vaccines were reviewed ( tetanus, pneumonia, shingles, flu) The patient's medication list was reviewed and updated.  The height and weight were entered.  BMI recorded in electronic record elsewhere  Cognitive screening was completed. Outcome of Mini - Cog: Passed   Falls /depression screening electronically recorded within record elsewhere None  Current tobacco usage:None (All patients who use tobacco were given written and verbal information on quitting)  Recent listing of emergency department/hospitalizations over the past year were reviewed.  current specialist the patient sees on a regular basis: No, and None   Medicare annual wellness visit patient questionnaire was reviewed.  A written screening schedule for the patient for the next 5-10 years was given. Appropriate discussion of followup regarding next visit was discussed.  Log rollever left foot  And has felt numbness since then    Now experiencing some numbness in right foot   Results for orders placed or performed in visit on 10/23/17  Lipid panel  Result Value Ref Range   Cholesterol, Total 213 (H) 100 - 199 mg/dL   Triglycerides 102 0 - 149 mg/dL   HDL 53 >39 mg/dL   VLDL Cholesterol Cal 20 5 - 40 mg/dL   LDL Calculated 140 (H) 0 - 99 mg/dL   Chol/HDL Ratio 4.0 0.0 - 5.0 ratio  Hepatic function panel  Result Value Ref Range   Total Protein 6.8 6.0 - 8.5 g/dL   Albumin 4.3 3.6 - 4.8 g/dL   Bilirubin Total 0.4 0.0 - 1.2 mg/dL   Bilirubin, Direct 0.09 0.00 - 0.40 mg/dL   Alkaline Phosphatase 48 39 - 117 IU/L   AST 19 0 - 40 IU/L   ALT 26 0 - 44 IU/L  CBC with Differential/Platelet  Result Value Ref Range   WBC 8.6 3.4 - 10.8  x10E3/uL   RBC 4.92 4.14 - 5.80 x10E6/uL   Hemoglobin 15.3 13.0 - 17.7 g/dL   Hematocrit 43.3 37.5 - 51.0 %   MCV 88 79 - 97 fL   MCH 31.1 26.6 - 33.0 pg   MCHC 35.3 31.5 - 35.7 g/dL   RDW 13.3 12.3 - 15.4 %   Platelets 242 150 - 450 x10E3/uL   Neutrophils 62 Not Estab. %   Lymphs 27 Not Estab. %   Monocytes 8 Not Estab. %   Eos 2 Not Estab. %   Basos 1 Not Estab. %   Neutrophils Absolute 5.3 1.4 - 7.0 x10E3/uL   Lymphocytes Absolute 2.3 0.7 - 3.1 x10E3/uL   Monocytes Absolute 0.7 0.1 - 0.9 x10E3/uL   EOS (ABSOLUTE) 0.2 0.0 - 0.4 x10E3/uL   Basophils Absolute 0.1 0.0 - 0.2 x10E3/uL   Immature Granulocytes 0 Not Estab. %   Immature Grans (Abs) 0.0 0.0 - 0.1 x10E3/uL  PSA  Result Value Ref Range   Prostate Specific Ag, Serum 1.0 0.0 - 4.0 ng/mL  Basic metabolic panel  Result Value Ref Range   Glucose 83 65 - 99 mg/dL   BUN 14 8 - 27 mg/dL   Creatinine, Ser 0.81 0.76 - 1.27 mg/dL   GFR calc non Af Amer 92 >59 mL/min/1.73   GFR calc Af Amer 106 >59 mL/min/1.73   BUN/Creatinine Ratio 17  10 - 24   Sodium 142 134 - 144 mmol/L   Potassium 4.7 3.5 - 5.2 mmol/L   Chloride 106 96 - 106 mmol/L   CO2 21 20 - 29 mmol/L   Calcium 9.4 8.6 - 10.2 mg/dL     Review of Systems  Constitutional: Negative for activity change, appetite change and fever.  HENT: Negative for congestion and rhinorrhea.   Eyes: Negative for discharge.  Respiratory: Negative for cough and wheezing.   Cardiovascular: Negative for chest pain.  Gastrointestinal: Negative for abdominal pain, blood in stool and vomiting.  Genitourinary: Negative for difficulty urinating and frequency.  Musculoskeletal: Negative for neck pain.  Skin: Negative for rash.  Allergic/Immunologic: Negative for environmental allergies and food allergies.  Neurological: Negative for weakness and headaches.  Psychiatric/Behavioral: Negative for agitation.  All other systems reviewed and are negative.      Objective:   Physical Exam    Constitutional: He appears well-developed and well-nourished.  HENT:  Head: Normocephalic and atraumatic.  Right Ear: External ear normal.  Left Ear: External ear normal.  Nose: Nose normal.  Mouth/Throat: Oropharynx is clear and moist.  Eyes: Pupils are equal, round, and reactive to light. EOM are normal.  Neck: Normal range of motion. Neck supple. No thyromegaly present.  Cardiovascular: Normal rate, regular rhythm and normal heart sounds.  No murmur heard. Pulmonary/Chest: Effort normal and breath sounds normal. No respiratory distress. He has no wheezes.  Abdominal: Soft. Bowel sounds are normal. He exhibits no distension and no mass. There is no tenderness.  Genitourinary: Penis normal.  Genitourinary Comments: Prostate gland within normal limits  Musculoskeletal: Normal range of motion. He exhibits no edema.  Lymphadenopathy:    He has no cervical adenopathy.  Neurological: He is alert. He exhibits normal muscle tone.  Feet distal pulses intact.  Sensation completely intact today.  Negative straight leg raise.  No change in sensation.  Patient does point to the plantar surface of both distal feet as the source for his occasional numbness and tingling.  Skin: Skin is warm and dry. No erythema.  Psychiatric: He has a normal mood and affect. His behavior is normal. Judgment normal.  Vitals reviewed.         Assessment & Plan:  Impression wellness exam.  Follow-up colonoscopy due discussed.  Diet discussed.  Exercise discussed.  Vaccines discussed.  Blood work discussed.  Anticipatory guidance given.  2.  Sensory neuropathy.  By history.  Major work-up not appropriate yet.  Discussed.  Developing over the last 6 months.  Sugar normal.  Exam overall stable.  Discussed.  Recommend some appropriate blood work rationale discussed.  Will follow this yearly or earlier if worsens discussed

## 2017-11-28 ENCOUNTER — Encounter: Payer: Self-pay | Admitting: Family Medicine

## 2017-11-28 LAB — TSH: TSH: 1.97 u[IU]/mL (ref 0.450–4.500)

## 2017-11-28 LAB — VITAMIN B12: Vitamin B-12: 271 pg/mL (ref 232–1245)

## 2017-11-28 LAB — FOLATE: Folate: >20 ng/mL (ref >3.0)

## 2017-12-02 ENCOUNTER — Encounter: Payer: Self-pay | Admitting: Family Medicine

## 2018-01-07 ENCOUNTER — Ambulatory Visit (INDEPENDENT_AMBULATORY_CARE_PROVIDER_SITE_OTHER): Payer: Self-pay

## 2018-01-07 DIAGNOSIS — Z86010 Personal history of colonic polyps: Secondary | ICD-10-CM

## 2018-01-07 MED ORDER — PEG 3350-KCL-NA BICARB-NACL 420 G PO SOLR: 4000.0000 mL | ORAL | 0 refills | Status: DC

## 2018-01-07 NOTE — Progress Notes (Signed)
Gastroenterology Pre-Procedure Review  Request Date:01/07/18 Requesting Physician: Dr.Lukingpt last tcs- RMR- 09/26/12- tubular adenoma- pt had bradycardia during procedure. Pt has had this evaluated and I spoke with RMR- ok to triage for procedure.   PATIENT REVIEW QUESTIONS: The patient responded to the following health history questions as indicated:    1. Diabetes Melitis: no 2. Joint replacements in the past 12 months: no 3. Major health problems in the past 3 months: no 4. Has an artificial valve or MVP: no 5. Has a defibrillator: no 6. Has been advised in past to take antibiotics in advance of a procedure like teeth cleaning: no 7. Family history of colon cancer: yes (paternal aunt)  40. Alcohol Use: no 9. History of sleep apnea: no  10. History of coronary artery or other vascular stents placed within the last 12 months: no 11. History of any prior anesthesia complications: no    MEDICATIONS & ALLERGIES:    Patient reports the following regarding taking any blood thinners:   Plavix? no Aspirin? yes (52m) Coumadin? no Brilinta? no Xarelto? no Eliquis? no Pradaxa? no Savaysa? no Effient? no  Patient confirms/reports the following medications:  Current Outpatient Medications  Medication Sig Dispense Refill  . aspirin 81 MG tablet Take 81 mg by mouth daily.     No current facility-administered medications for this visit.     Patient confirms/reports the following allergies:  No Known Allergies  No orders of the defined types were placed in this encounter.   AUTHORIZATION INFORMATION Primary Insurance: ABernad[employer] Person  ID #: [id number]-Cert / Auth required: no This is the new insurance information that the pt will have in January. He currently has BCBS .   SCHEDULE INFORMATION: Procedure has been scheduled as follows:  Date: 03/13/18, Time: 12:00 Location: [facility] Dr.Rourk  This Gastroenterology Pre-Precedure Review Form is being routed to the following  provider(s): ARoseanne KaufmanNP

## 2018-01-07 NOTE — Patient Instructions (Signed)
Miguel Morrow   29-Mar-1950 MRN: [id number]    Procedure Date: 03/13/18 Time to register: 11:00am Place to register: [facility] Procedure Time: 12:00pm  Scheduled provider: R. Garfield Cornea, MD  PREPARATION FOR COLONOSCOPY WITH TRI-LYTE SPLIT PREP  Please notify us immediately if you are diabetic, take iron supplements, or if you are on Coumadin or any other blood thinners.   You will need to purchase 1 fleet enema and 1 box of Bisacodyl 57m tablets.   2 DAYS BEFORE PROCEDURE:  DATE: 03/11/18  DAY: Monday Begin clear liquid diet AFTER your lunch meal. NO SOLID FOODS after this point.  1 DAY BEFORE PROCEDURE:  DATE: 03/12/18  DAY: Tuesday Continue clear liquids the entire day - NO SOLID FOOD.   At 2:00 pm:  Take 2 Bisacodyl tablets.   At 4:00pm:  Start drinking your solution. Make sure you mix well per instructions on the bottle. Try to drink 1 (one) 8 ounce glass every 10-15 minutes until you have consumed HALF the jug. You should complete by 6:00pm.You must keep the left over solution refrigerated until completed next day.  Continue clear liquids. You must drink plenty of clear liquids to prevent dehyration and kidney failure.     DAY OF PROCEDURE:   DATE: 03/13/18   DAY: Wednesday If you take medications for your heart, blood pressure or breathing, you may take these medications.   Five hours before your procedure time @ 7:00am:  Finish remaining amout of bowel prep, drinking 1 (one) 8 ounce glass every 10-15 minutes until complete. You have two hours to consume remaining prep.   Three hours before your procedure time @9 :00am:  Nothing by mouth.   At least one hour before going to the hospital:  Give yourself one Fleet enema.  You may take your morning medications with sip of water unless we have instructed otherwise.      Please see below for Dietary Information.  CLEAR LIQUIDS INCLUDE:  Water Jello (NOT red in color)   Ice Popsicles (NOT red in color)   Tea  (sugar ok, no milk/cream) Powdered fruit flavored drinks  Coffee (sugar ok, no milk/cream) Gatorade/ Lemonade/ Kool-Aid  (NOT red in color)   Juice: apple, white grape, white cranberry Soft drinks  Clear bullion, consomme, broth (fat free beef/chicken/vegetable)  Carbonated beverages (any kind)  Strained chicken noodle soup Hard Candy   Remember: Clear liquids are liquids that will allow you to see your fingers on the other side of a clear glass. Be sure liquids are NOT red in color, and not cloudy, but CLEAR.  DO NOT EAT OR DRINK ANY OF THE FOLLOWING:  Dairy products of any kind   Cranberry juice Tomato juice / V8 juice   Grapefruit juice Orange juice     Red grape juice  Do not eat any solid foods, including such foods as: cereal, oatmeal, yogurt, fruits, vegetables, creamed soups, eggs, bread, crackers, pureed foods in a blender, etc.   HELPFUL HINTS FOR DRINKING PREP SOLUTION:   Make sure prep is extremely cold. Mix and refrigerate the the morning of the prep. You may also put in the freezer.   You may try mixing some Crystal Light or Country Time Lemonade if you prefer. Mix in small amounts; add more if necessary.  Try drinking through a straw  Rinse mouth with water or a mouthwash between glasses, to remove after-taste.  Try sipping on a cold beverage /ice/ popsicles between glasses of prep.  Place  a piece of sugar-free hard candy in mouth between glasses.  If you become nauseated, try consuming smaller amounts, or stretch out the time between glasses. Stop for 30-60 minutes, then slowly start back drinking.        OTHER INSTRUCTIONS  You will need a responsible adult at least 67 years of age to accompany you and drive you home. This person must remain in the waiting room during your procedure. The hospital will cancel your procedure if you do not have a responsible adult with you.   1. Wear loose fitting clothing that is easily removed. 2. Leave jewelry and other  valuables at home.  3. Remove all body piercing jewelry and leave at home. 4. Total time from sign-in until discharge is approximately 2-3 hours. 5. You should go home directly after your procedure and rest. You can resume normal activities the day after your procedure. 6. The day of your procedure you should not:  Drive  Make legal decisions  Operate machinery  Drink alcohol  Return to work   You may call the office (Dept: [phone]) before 5:00pm, or page the doctor on call [phone]) after 5:00pm, for further instructions, if necessary.   Insurance Information YOU WILL NEED TO CHECK WITH YOUR INSURANCE COMPANY FOR THE BENEFITS OF COVERAGE YOU HAVE FOR THIS PROCEDURE.  UNFORTUNATELY, NOT ALL INSURANCE COMPANIES HAVE BENEFITS TO COVER ALL OR PART OF THESE TYPES OF PROCEDURES.  IT IS YOUR RESPONSIBILITY TO CHECK YOUR BENEFITS, HOWEVER, WE WILL BE GLAD TO ASSIST YOU WITH ANY CODES YOUR INSURANCE COMPANY MAY NEED.    PLEASE NOTE THAT MOST INSURANCE COMPANIES WILL NOT COVER A SCREENING COLONOSCOPY FOR PEOPLE UNDER THE AGE OF 50  IF YOU HAVE BCBS INSURANCE, YOU MAY HAVE BENEFITS FOR A SCREENING COLONOSCOPY BUT IF POLYPS ARE FOUND THE DIAGNOSIS WILL CHANGE AND THEN YOU MAY HAVE A DEDUCTIBLE THAT WILL NEED TO BE MET. SO PLEASE MAKE SURE YOU CHECK YOUR BENEFITS FOR A SCREENING COLONOSCOPY AS WELL AS A DIAGNOSTIC COLONOSCOPY.

## 2018-01-08 NOTE — Progress Notes (Signed)
Appropriate.

## 2018-03-11 ENCOUNTER — Telehealth: Payer: Self-pay | Admitting: *Deleted

## 2018-03-11 MED ORDER — PEG 3350-KCL-NA BICARB-NACL 420 G PO SOLR: 4000.0000 mL | Freq: Once | ORAL | 0 refills | Status: AC

## 2018-03-11 NOTE — Telephone Encounter (Signed)
Noted thanks °

## 2018-03-11 NOTE — Telephone Encounter (Signed)
received fax from [facility] in [place] requesting Rx for patient prep to be sent in. Looks like it was sent in back in December to [facility]. Rx sent into [facility]. FYI to JL.

## 2018-03-13 ENCOUNTER — Other Ambulatory Visit: Payer: Self-pay

## 2018-03-13 ENCOUNTER — Encounter (HOSPITAL_COMMUNITY): Payer: Self-pay | Admitting: *Deleted

## 2018-03-13 ENCOUNTER — Ambulatory Visit (HOSPITAL_COMMUNITY)
Admission: RE | Admit: 2018-03-13 | Discharge: 2018-03-13 | Disposition: A | Discharge status: Home / Self Care | Payer: Medicare HMO | Attending: Internal Medicine | Admitting: Internal Medicine

## 2018-03-13 ENCOUNTER — Encounter (HOSPITAL_COMMUNITY)
Admission: RE | Disposition: A | Discharge status: Home / Self Care | Payer: Self-pay | Source: Home / Self Care | Attending: Internal Medicine

## 2018-03-13 DIAGNOSIS — Z79899 Other long term (current) drug therapy: Secondary | ICD-10-CM | POA: Diagnosis not present

## 2018-03-13 DIAGNOSIS — D120 Benign neoplasm of cecum: Secondary | ICD-10-CM | POA: Insufficient documentation

## 2018-03-13 DIAGNOSIS — Z7982 Long term (current) use of aspirin: Secondary | ICD-10-CM | POA: Insufficient documentation

## 2018-03-13 DIAGNOSIS — D122 Benign neoplasm of ascending colon: Secondary | ICD-10-CM | POA: Diagnosis not present

## 2018-03-13 DIAGNOSIS — D124 Benign neoplasm of descending colon: Secondary | ICD-10-CM

## 2018-03-13 DIAGNOSIS — Z1211 Encounter for screening for malignant neoplasm of colon: Secondary | ICD-10-CM | POA: Diagnosis present

## 2018-03-13 DIAGNOSIS — Z87891 Personal history of nicotine dependence: Secondary | ICD-10-CM | POA: Insufficient documentation

## 2018-03-13 DIAGNOSIS — K5730 Diverticulosis of large intestine without perforation or abscess without bleeding: Secondary | ICD-10-CM | POA: Diagnosis not present

## 2018-03-13 DIAGNOSIS — Z86010 Personal history of colonic polyps: Secondary | ICD-10-CM | POA: Insufficient documentation

## 2018-03-13 HISTORY — PX: POLYPECTOMY: SHX5525

## 2018-03-13 HISTORY — PX: COLONOSCOPY: SHX5424

## 2018-03-13 SURGERY — COLONOSCOPY
Anesthesia: Moderate Sedation

## 2018-03-13 MED ORDER — SODIUM CHLORIDE 0.9 % IV SOLN
INTRAVENOUS | Status: DC
  Administered 2018-03-13: 11:00:00 via INTRAVENOUS

## 2018-03-13 MED ORDER — ONDANSETRON HCL 4 MG/2ML IJ SOLN
INTRAMUSCULAR | Status: AC
  Filled 2018-03-13: qty 2

## 2018-03-13 MED ORDER — MIDAZOLAM HCL 5 MG/5ML IJ SOLN
INTRAMUSCULAR | Status: DC | PRN
  Administered 2018-03-13 (×3): 1 mg via INTRAVENOUS
  Administered 2018-03-13: 2 mg via INTRAVENOUS
  Administered 2018-03-13: 1 mg via INTRAVENOUS

## 2018-03-13 MED ORDER — MEPERIDINE HCL 100 MG/ML IJ SOLN
INTRAMUSCULAR | Status: DC | PRN
  Administered 2018-03-13: 25 mg

## 2018-03-13 MED ORDER — MIDAZOLAM HCL 5 MG/5ML IJ SOLN
INTRAMUSCULAR | Status: AC
  Filled 2018-03-13: qty 10

## 2018-03-13 MED ORDER — STERILE WATER FOR IRRIGATION IR SOLN
Status: DC | PRN
  Administered 2018-03-13: 11:00:00

## 2018-03-13 MED ORDER — MEPERIDINE HCL 50 MG/ML IJ SOLN
INTRAMUSCULAR | Status: AC
  Filled 2018-03-13: qty 1

## 2018-03-13 MED ORDER — ONDANSETRON HCL 4 MG/2ML IJ SOLN
INTRAMUSCULAR | Status: DC | PRN
  Administered 2018-03-13: 4 mg via INTRAVENOUS

## 2018-03-13 NOTE — Op Note (Signed)
[facility] Patient Name: Miguel Morrow Procedure Date: 03/13/2018 10:51 AM MRN: [id number] Date of Birth: [birth year] Attending MD: Gennette Pac , MD CSN: [id number] Age: 68 Admit Type: Outpatient Procedure:                Colonoscopy Indications:              High risk colon cancer surveillance: Personal                            history of colonic polyps Providers:                Gennette Pac, MD, Buel Ream. Thomasena Edis RN, RN,                            Sterling Big, RN, Edythe Clarity, Technician Referring MD:              Medicines:                Midazolam 6 mg IV, Meperidine 25 mg IV, Complications:            No immediate complications. Estimated Blood Loss:     Estimated blood loss was minimal. Procedure:                Pre-Anesthesia Assessment:                           - Prior to the procedure, a History and Physical                            was performed, and patient medications and                            allergies were reviewed. The patient's tolerance of                            previous anesthesia was also reviewed. The risks                            and benefits of the procedure and the sedation                            options and risks were discussed with the patient.                            All questions were answered, and informed consent                            was obtained. Prior Anticoagulants: The patient has                            taken no previous anticoagulant or antiplatelet                            agents. ASA Grade Assessment: II - A patient with  mild systemic disease. After reviewing the risks                            and benefits, the patient was deemed in                            satisfactory condition to undergo the procedure.                           After obtaining informed consent, the colonoscope                            was passed under direct vision. Throughout the                    procedure, the patient's blood pressure, pulse, and                            oxygen saturations were monitored continuously. The                            CF-[id number] ([id number]) scope was introduced through                            the anus and advanced to the the cecum, identified                            by appendiceal orifice and ileocecal valve. The                            colonoscopy was performed without difficulty. The                            patient tolerated the procedure well. The quality                            of the bowel preparation was adequate. The                            ileocecal valve, appendiceal orifice, and rectum                            were photographed. The entire colon was well                            visualized. Scope In: 11:01:39 AM Scope Out: 11:18:15 AM Scope Withdrawal Time: 0 hours 9 minutes 51 seconds  Total Procedure Duration: 0 hours 16 minutes 36 seconds  Findings:      The perianal and digital rectal examinations were normal.      Six semi-pedunculated polyps were found in the descending colon,       ascending colon and cecum. The polyps were 4 to 6 mm in size. These       polyps were removed with a cold snare. Resection and retrieval were       complete. Estimated blood  loss was minimal.      Multiple small and large-mouthed diverticula were found in the sigmoid       colon and descending colon.      The exam was otherwise without abnormality on direct and retroflexion       views. Impression:               - Six 4 to 6 mm polyps in the descending colon, in                            the ascending colon and in the cecum, removed with                            a cold snare. Resected and retrieved.                           - Diverticulosis in the sigmoid colon and in the                            descending colon.                           - The examination was otherwise normal on direct                             and retroflexion views. Moderate Sedation:      Moderate (conscious) sedation was administered by the endoscopy nurse       and supervised by the endoscopist. The following parameters were       monitored: oxygen saturation, heart rate, blood pressure, respiratory       rate, EKG, adequacy of pulmonary ventilation, and response to care.       Total physician intraservice time was 21 minutes. Recommendation:           - Patient has a contact number available for                            emergencies. The signs and symptoms of potential                            delayed complications were discussed with the                            patient. Return to normal activities tomorrow.                            Written discharge instructions were provided to the                            patient.                           - Resume previous diet.                           - Continue present medications.                           -  Repeat colonoscopy date to be determined after                            pending pathology results are reviewed for                            surveillance based on pathology results.                           - Return to GI office (date not yet determined). Procedure Code(s):        --- Professional ---                           [phone], Colonoscopy, flexible; with removal of                            tumor(s), polyp(s), or other lesion(s) by snare                            technique                           G0500, Moderate sedation services provided by the                            same physician or other qualified health care                            professional performing a gastrointestinal                            endoscopic service that sedation supports,                            requiring the presence of an independent trained                            observer to assist in the monitoring of the                            patient's level of  consciousness and physiological                            status; initial 15 minutes of intra-service time;                            patient age 42 years or older (additional time may                            be reported with 46962, as appropriate) Diagnosis Code(s):        --- Professional ---                           Z86.010, Personal history of colonic polyps  D12.4, Benign neoplasm of descending colon                           D12.2, Benign neoplasm of ascending colon                           D12.0, Benign neoplasm of cecum                           K57.30, Diverticulosis of large intestine without                            perforation or abscess without bleeding CPT copyright 2018 American Medical Association. All rights reserved. The codes documented in this report are preliminary and upon coder review may  be revised to meet current compliance requirements. Gerrit Friends. Arryn Terrones, MD Gennette Pac, MD 03/13/2018 11:23:40 AM This report has been signed electronically. Number of Addenda: 0

## 2018-03-13 NOTE — H&P (Signed)
@LOGO @   Primary Care Physician:  Mikey Kirschner, MD Primary Gastroenterologist:  Dr. Gala Romney  Pre-Procedure History & Physical: HPI:  Miguel Morrow is a 68 y.o. male here for for surveillance colonoscopy.  History of colonic adenoma.  Chronic bradycardia -asymptomatic.  No GI symptoms currently.  Past Medical History:  Diagnosis Date  . Bradycardia   . Nephrolithiasis 1984    Past Surgical History:  Procedure Laterality Date  . COLONOSCOPY N/A 09/26/2012   Procedure: COLONOSCOPY;  Surgeon: Daneil Dolin, MD;  Location: [facility];  Service: Endoscopy;  Laterality: N/A;  1:45  . Kidney stone basket extraction  1984    Prior to Admission medications   Medication Sig Start Date End Date Taking? Authorizing Provider  aspirin 81 MG tablet Take 81 mg by mouth daily.   Yes [provider]  ibuprofen (ADVIL,MOTRIN) 200 MG tablet Take 200 mg by mouth 2 (two) times daily as needed for headache or moderate pain.   Yes [provider]  loratadine (CLARITIN) 10 MG tablet Take 10 mg by mouth daily as needed for allergies.   Yes [provider]  polyethylene glycol-electrolytes (TRILYTE) 420 g solution Take 4,000 mLs by mouth as directed. 01/07/18  Yes Annitta Needs, NP  Psyllium (METAMUCIL PO) Take 1 Dose by mouth daily.   Yes [provider]    Allergies as of 01/07/2018  . (No Known Allergies)    Family History  Problem Relation Age of Onset  . Heart disease Father        Died age 27  . Colon cancer Paternal Aunt        Greater than age of 44  . Heart disease Paternal Uncle   . Liver disease Neg Hx     Social History   Socioeconomic History  . Marital status: Married    Spouse name: Not on file  . Number of children: 2  . Years of education: Not on file  . Highest education level: Not on file  Occupational History  . Occupation: Arts development officer  . Financial resource strain: Not on file  . Food insecurity:    Worry: Not on  file    Inability: Not on file  . Transportation needs:    Medical: Not on file    Non-medical: Not on file  Tobacco Use  . Smoking status: Former Smoker    Types: Cigarettes    Last attempt to quit: 01/31/2008    Years since quitting: 10.1  . Smokeless tobacco: Former Systems developer    Quit date: 05/17/2008  Substance and Sexual Activity  . Alcohol use: No    Alcohol/week: 0.0 standard drinks  . Drug use: No  . Sexual activity: Yes  Lifestyle  . Physical activity:    Days per week: Not on file    Minutes per session: Not on file  . Stress: Not on file  Relationships  . Social connections:    Talks on phone: Not on file    Gets together: Not on file    Attends religious service: Not on file    Active member of club or organization: Not on file    Attends meetings of clubs or organizations: Not on file    Relationship status: Not on file  . Intimate partner violence:    Fear of current or ex partner: Not on file    Emotionally abused: Not on file    Physically abused: Not on file  Forced sexual activity: Not on file  Other Topics Concern  . Not on file  Social History Narrative  . Not on file    Review of Systems: See HPI, otherwise negative ROS  Physical Exam: BP (!) 151/79   Pulse (!) 56   Temp 97.8 F (36.6 C) (Oral)   Resp 12   Ht 5' 6"  (1.676 m)   Wt 78 kg   SpO2 98%   BMI 27.76 kg/m  General:   Alert,  Well-developed, well-nourished, pleasant and cooperative in NAD Neck:  Supple; no masses or thyromegaly. No significant cervical adenopathy. Lungs:  Clear throughout to auscultation.   No wheezes, crackles, or rhonchi. No acute distress. Heart:  Regular rate and rhythm; no murmurs, clicks, rubs,  or gallops. Abdomen: Non-distended, normal bowel sounds.  Soft and nontender without appreciable mass or hepatosplenomegaly.  Pulses:  Normal pulses noted. Extremities:  Without clubbing or edema.  Impression/Plan: 68 year old gentleman history of colonic adenoma.  Here  for surveillance colonoscopy.  The risks, benefits, limitations, alternatives and imponderables have been reviewed with the patient. Questions have been answered. All parties are agreeable.      Notice: This dictation was prepared with Dragon dictation along with smaller phrase technology. Any transcriptional errors that result from this process are unintentional and may not be corrected upon review.

## 2018-03-13 NOTE — Discharge Instructions (Signed)
Colonoscopy Discharge Instructions  Read the instructions outlined below and refer to this sheet in the next few weeks. These discharge instructions provide you with general information on caring for yourself after you leave the hospital. Your doctor may also give you specific instructions. While your treatment has been planned according to the most current medical practices available, unavoidable complications occasionally occur. If you have any problems or questions after discharge, call Dr. Gala Romney at [phone]. ACTIVITY  You may resume your regular activity, but move at a slower pace for the next 24 hours.   Take frequent rest periods for the next 24 hours.   Walking will help get rid of the air and reduce the bloated feeling in your belly (abdomen).   No driving for 24 hours (because of the medicine (anesthesia) used during the test).    Do not sign any important legal documents or operate any machinery for 24 hours (because of the anesthesia used during the test).  NUTRITION  Drink plenty of fluids.   You may resume your normal diet as instructed by your doctor.   Begin with a light meal and progress to your normal diet. Heavy or fried foods are harder to digest and may make you feel sick to your stomach (nauseated).   Avoid alcoholic beverages for 24 hours or as instructed.  MEDICATIONS  You may resume your normal medications unless your doctor tells you otherwise.  WHAT YOU CAN EXPECT TODAY  Some feelings of bloating in the abdomen.   Passage of more gas than usual.   Spotting of blood in your stool or on the toilet paper.  IF YOU HAD POLYPS REMOVED DURING THE COLONOSCOPY:  No aspirin products for 7 days or as instructed.   No alcohol for 7 days or as instructed.   Eat a soft diet for the next 24 hours.  FINDING OUT THE RESULTS OF YOUR TEST Not all test results are available during your visit. If your test results are not back during the visit, make an appointment  with your caregiver to find out the results. Do not assume everything is normal if you have not heard from your caregiver or the medical facility. It is important for you to follow up on all of your test results.  SEEK IMMEDIATE MEDICAL ATTENTION IF:  You have more than a spotting of blood in your stool.   Your belly is swollen (abdominal distention).   You are nauseated or vomiting.   You have a temperature over 101.   You have abdominal pain or discomfort that is severe or gets worse throughout the day.   Colon polyp and diverticulosis information provided  Further recommendations to follow pending review of pathology report   Diverticulosis  Diverticulosis is a condition that develops when small pouches (diverticula) form in the wall of the large intestine (colon). The colon is where water is absorbed and stool is formed. The pouches form when the inside layer of the colon pushes through weak spots in the outer layers of the colon. You may have a few pouches or many of them. What are the causes? The cause of this condition is not known. What increases the risk? The following factors may make you more likely to develop this condition:  Being older than age 49. Your risk for this condition increases with age. Diverticulosis is rare among people younger than age 30. By age 20, many people have it.  Eating a low-fiber diet.  Having frequent constipation.  Being overweight.  Not getting enough exercise.  Smoking.  Taking over-the-counter pain medicines, like aspirin and ibuprofen.  Having a family history of diverticulosis. What are the signs or symptoms? In most people, there are no symptoms of this condition. If you do have symptoms, they may include:  Bloating.  Cramps in the abdomen.  Constipation or diarrhea.  Pain in the lower left side of the abdomen. How is this diagnosed? This condition is most often diagnosed during an exam for other colon problems. Because  diverticulosis usually has no symptoms, it often cannot be diagnosed independently. This condition may be diagnosed by:  Using a flexible scope to examine the colon (colonoscopy).  Taking an X-ray of the colon after dye has been put into the colon (barium enema).  Doing a CT scan. How is this treated? You may not need treatment for this condition if you have never developed an infection related to diverticulosis. If you have had an infection before, treatment may include:  Eating a high-fiber diet. This may include eating more fruits, vegetables, and grains.  Taking a fiber supplement.  Taking a live bacteria supplement (probiotic).  Taking medicine to relax your colon.  Taking antibiotic medicines. Follow these instructions at home:  Drink 6-8 glasses of water or more each day to prevent constipation.  Try not to strain when you have a bowel movement.  If you have had an infection before: ? Eat more fiber as directed by your health care provider or your diet and nutrition specialist (dietitian). ? Take a fiber supplement or probiotic, if your health care provider approves.  Take over-the-counter and prescription medicines only as told by your health care provider.  If you were prescribed an antibiotic, take it as told by your health care provider. Do not stop taking the antibiotic even if you start to feel better.  Keep all follow-up visits as told by your health care provider. This is important. Contact a health care provider if:  You have pain in your abdomen.  You have bloating.  You have cramps.  You have not had a bowel movement in 3 days. Get help right away if:  Your pain gets worse.  Your bloating becomes very bad.  You have a fever or chills, and your symptoms suddenly get worse.  You vomit.  You have bowel movements that are bloody or black.  You have bleeding from your rectum. Summary  Diverticulosis is a condition that develops when small pouches  (diverticula) form in the wall of the large intestine (colon).  You may have a few pouches or many of them.  This condition is most often diagnosed during an exam for other colon problems.  If you have had an infection related to diverticulosis, treatment may include increasing the fiber in your diet, taking supplements, or taking medicines. This information is not intended to replace advice given to you by your health care provider. Make sure you discuss any questions you have with your health care provider. Document Released: 10/14/2003 Document Revised: 12/06/2015 Document Reviewed: 12/06/2015 Elsevier Interactive Patient Education  2019 [place] [employer].  Colon Polyps  Polyps are tissue growths inside the body. Polyps can grow in many places, including the large intestine (colon). A polyp may be a round bump or a mushroom-shaped growth. You could have one polyp or several. Most colon polyps are noncancerous (benign). However, some colon polyps can become cancerous over time. Finding and removing the polyps early can help prevent this. What are the causes? The exact cause  of colon polyps is not known. What increases the risk? You are more likely to develop this condition if you:  Have a family history of colon cancer or colon polyps.  Are older than 51 or older than 45 if you are African American.  Have inflammatory bowel disease, such as ulcerative colitis or Crohn's disease.  Have certain hereditary conditions, such as: ? Familial adenomatous polyposis. ? Lynch syndrome. ? Turcot syndrome. ? Peutz-Jeghers syndrome.  Are overweight.  Smoke cigarettes.  Do not get enough exercise.  Drink too much alcohol.  Eat a diet that is high in fat and red meat and low in fiber.  Had childhood cancer that was treated with abdominal radiation. What are the signs or symptoms? Most polyps do not cause symptoms. If you have symptoms, they may include:  Blood coming from your rectum  when having a bowel movement.  Blood in your stool. The stool may look dark red or black.  Abdominal pain.  A change in bowel habits, such as constipation or diarrhea. How is this diagnosed? This condition is diagnosed with a colonoscopy. This is a procedure in which a lighted, flexible scope is inserted into the anus and then passed into the colon to examine the area. Polyps are sometimes found when a colonoscopy is done as part of routine cancer screening tests. How is this treated? Treatment for this condition involves removing any polyps that are found. Most polyps can be removed during a colonoscopy. Those polyps will then be tested for cancer. Additional treatment may be needed depending on the results of testing. Follow these instructions at home: Lifestyle  Maintain a healthy weight, or lose weight if recommended by your health care provider.  Exercise every day or as told by your health care provider.  Do not use any products that contain nicotine or tobacco, such as cigarettes and e-cigarettes. If you need help quitting, ask your health care provider.  If you drink alcohol, limit how much you have: ? 0-1 drink a day for women. ? 0-2 drinks a day for men.  Be aware of how much alcohol is in your drink. In the [place] one drink equals one 12 oz bottle of beer (355 mL), one 5 oz glass of wine (148 mL), or one 1 oz shot of hard liquor (44 mL). Eating and drinking   Eat foods that are high in fiber, such as fruits, vegetables, and whole grains.  Eat foods that are high in calcium and vitamin D, such as milk, cheese, yogurt, eggs, liver, fish, and broccoli.  Limit foods that are high in fat, such as fried foods and desserts.  Limit the amount of red meat and processed meat you eat, such as hot dogs, sausage, bacon, and lunch meats. General instructions  Keep all follow-up visits as told by your health care provider. This is important. ? This includes having regularly  scheduled colonoscopies. ? Talk to your health care provider about when you need a colonoscopy. Contact a health care provider if:  You have new or worsening bleeding during a bowel movement.  You have new or increased blood in your stool.  You have a change in bowel habits.  You lose weight for no known reason. Summary  Polyps are tissue growths inside the body. Polyps can grow in many places, including the colon.  Most colon polyps are noncancerous (benign), but some can become cancerous over time.  This condition is diagnosed with a colonoscopy.  Treatment for this condition involves  removing any polyps that are found. Most polyps can be removed during a colonoscopy. This information is not intended to replace advice given to you by your health care provider. Make sure you discuss any questions you have with your health care provider. Document Released: 10/13/2003 Document Revised: 05/03/2017 Document Reviewed: 05/03/2017 Elsevier Interactive Patient Education  2019 [employer].

## 2018-03-14 ENCOUNTER — Encounter: Payer: Self-pay | Admitting: Internal Medicine

## 2018-03-18 ENCOUNTER — Encounter (HOSPITAL_COMMUNITY): Payer: Self-pay | Admitting: Internal Medicine

## 2018-04-15 ENCOUNTER — Emergency Department (HOSPITAL_COMMUNITY): Payer: Medicare HMO

## 2018-04-15 ENCOUNTER — Other Ambulatory Visit: Payer: Self-pay

## 2018-04-15 ENCOUNTER — Encounter (HOSPITAL_COMMUNITY): Payer: Self-pay | Admitting: Emergency Medicine

## 2018-04-15 ENCOUNTER — Emergency Department (HOSPITAL_COMMUNITY)
Admission: EM | Admit: 2018-04-15 | Discharge: 2018-04-15 | Disposition: A | Discharge status: Home / Self Care | Payer: Medicare HMO | Attending: Emergency Medicine | Admitting: Emergency Medicine

## 2018-04-15 DIAGNOSIS — R109 Unspecified abdominal pain: Secondary | ICD-10-CM | POA: Insufficient documentation

## 2018-04-15 DIAGNOSIS — N12 Tubulo-interstitial nephritis, not specified as acute or chronic: Secondary | ICD-10-CM

## 2018-04-15 DIAGNOSIS — Z87891 Personal history of nicotine dependence: Secondary | ICD-10-CM | POA: Insufficient documentation

## 2018-04-15 DIAGNOSIS — Z7982 Long term (current) use of aspirin: Secondary | ICD-10-CM | POA: Insufficient documentation

## 2018-04-15 DIAGNOSIS — R1110 Vomiting, unspecified: Secondary | ICD-10-CM | POA: Diagnosis not present

## 2018-04-15 DIAGNOSIS — N200 Calculus of kidney: Secondary | ICD-10-CM | POA: Diagnosis not present

## 2018-04-15 LAB — BASIC METABOLIC PANEL
Anion gap: 8 (ref 5–15)
BUN: 17 mg/dL (ref 8–23)
CO2: 21 mmol/L — ABNORMAL LOW (ref 22–32)
Calcium: 8.4 mg/dL — ABNORMAL LOW (ref 8.9–10.3)
Chloride: 107 mmol/L (ref 98–111)
Creatinine, Ser: 0.97 mg/dL (ref 0.61–1.24)
GFR calc non Af Amer: >60 mL/min (ref >60)
Glucose, Bld: 123 mg/dL — ABNORMAL HIGH (ref 70–99)
Potassium: 3.3 mmol/L — ABNORMAL LOW (ref 3.5–5.1)
Sodium: 136 mmol/L (ref 135–145)

## 2018-04-15 LAB — CBC WITH DIFFERENTIAL/PLATELET
ABS IMMATURE GRANULOCYTES: 0.55 10*3/uL — AB (ref 0.00–0.07)
Basophils Absolute: 0.1 10*3/uL (ref 0.0–0.1)
Basophils Relative: 0 %
Eosinophils Absolute: 0.1 10*3/uL (ref 0.0–0.5)
Eosinophils Relative: 1 %
HCT: 39.9 % (ref 39.0–52.0)
Hemoglobin: 13.2 g/dL (ref 13.0–17.0)
Immature Granulocytes: 3 %
Lymphocytes Relative: 5 %
Lymphs Abs: 1 10*3/uL (ref 0.7–4.0)
MCH: 30.3 pg (ref 26.0–34.0)
MCHC: 33.1 g/dL (ref 30.0–36.0)
MCV: 91.5 fL (ref 80.0–100.0)
Monocytes Absolute: 1.7 10*3/uL — ABNORMAL HIGH (ref 0.1–1.0)
Monocytes Relative: 8 %
NEUTROS PCT: 83 %
Neutro Abs: 18.4 10*3/uL — ABNORMAL HIGH (ref 1.7–7.7)
Platelets: 179 10*3/uL (ref 150–400)
RBC: 4.36 MIL/uL (ref 4.22–5.81)
RDW: 13.8 % (ref 11.5–15.5)
WBC: 21.8 10*3/uL — ABNORMAL HIGH (ref 4.0–10.5)
nRBC: 0 % (ref 0.0–0.2)

## 2018-04-15 LAB — URINALYSIS, ROUTINE W REFLEX MICROSCOPIC
Bilirubin Urine: NEGATIVE
Glucose, UA: NEGATIVE mg/dL
Ketones, ur: NEGATIVE mg/dL
Leukocytes,Ua: NEGATIVE
Nitrite: POSITIVE — AB
Protein, ur: 100 mg/dL — AB
Specific Gravity, Urine: 1.023 (ref 1.005–1.030)
WBC, UA: >50 WBC/hpf — ABNORMAL HIGH (ref 0–5)
pH: 5 (ref 5.0–8.0)

## 2018-04-15 MED ORDER — SODIUM CHLORIDE 0.9 % IV BOLUS
1000.0000 mL | Freq: Once | INTRAVENOUS | Status: AC
  Administered 2018-04-15: 1000 mL via INTRAVENOUS

## 2018-04-15 MED ORDER — ACETAMINOPHEN 325 MG PO TABS
ORAL_TABLET | ORAL | Status: AC
  Administered 2018-04-15: 650 mg via ORAL
  Filled 2018-04-15: qty 2

## 2018-04-15 MED ORDER — CEPHALEXIN 500 MG PO CAPS
500.0000 mg | ORAL_CAPSULE | Freq: Once | ORAL | Status: AC
  Administered 2018-04-15: 500 mg via ORAL
  Filled 2018-04-15: qty 1

## 2018-04-15 MED ORDER — ACETAMINOPHEN 325 MG PO TABS
650.0000 mg | ORAL_TABLET | Freq: Once | ORAL | Status: AC
  Administered 2018-04-15: 650 mg via ORAL

## 2018-04-15 MED ORDER — CEPHALEXIN 500 MG PO CAPS: 500.0000 mg | ORAL_CAPSULE | Freq: Four times a day (QID) | ORAL | 0 refills | Status: DC

## 2018-04-15 MED ORDER — SODIUM CHLORIDE 0.9 % IV SOLN
2.0000 g | Freq: Once | INTRAVENOUS | Status: AC
  Administered 2018-04-15: 2 g via INTRAVENOUS
  Filled 2018-04-15: qty 20

## 2018-04-15 NOTE — ED Provider Notes (Signed)
[facility] EMERGENCY DEPARTMENT Provider Note   CSN: [id number] Arrival date & time: 04/15/18  1803    History   Chief Complaint Chief Complaint  Patient presents with  . Flank Pain    HPI Miguel Morrow is a 68 y.o. male.     Left flank pain since Friday, getting worse in the past 24 hours with associated diaphoresis, clamminess, fever, decreased urinary output, dysuria.  He has had kidney stones in the past.  He has taken nothing at home.  Severity is moderate.     Past Medical History:  Diagnosis Date  . Bradycardia   . Nephrolithiasis 1984    Patient Active Problem List   Diagnosis Date Noted  . Essential hypertension 10/31/2016  . Osteoarthritis of right knee 02/09/2014  . Ulnar neuropathy at elbow of left upper extremity 02/09/2014  . Rash and nonspecific skin eruption 02/09/2014  . Abnormal cardiovascular stress test 11/21/2012  . Precordial pain 10/22/2012  . Bradycardia 10/03/2012  . Hemorrhoid 09/24/2012  . Bowel habit changes 09/24/2012  . Mixed hyperlipidemia 08/20/2012    Past Surgical History:  Procedure Laterality Date  . COLONOSCOPY N/A 09/26/2012   Procedure: COLONOSCOPY;  Surgeon: Daneil Dolin, MD;  [facility]: [facility];  Service: Endoscopy;  Laterality: N/A;  1:45  . COLONOSCOPY N/A 03/13/2018   Procedure: COLONOSCOPY;  Surgeon: Daneil Dolin, MD;  Location: [facility];  Service: Endoscopy;  Laterality: N/A;  12:00  . Kidney stone basket extraction  1984  . POLYPECTOMY  03/13/2018   Procedure: POLYPECTOMY;  Surgeon: Daneil Dolin, MD;  Location: [facility];  Service: Endoscopy;;  colon        Home Medications    Prior to Admission medications   Medication Sig Start Date End Date Taking? Authorizing Provider  aspirin 81 MG tablet Take 81 mg by mouth daily.   Yes [provider]  ibuprofen (ADVIL,MOTRIN) 200 MG tablet Take 200 mg by mouth 2 (two) times daily as needed for headache or moderate pain.   Yes [provider]  loratadine (CLARITIN) 10 MG tablet Take 10 mg by mouth daily as needed for allergies.   Yes [provider]  Phenazopyridine HCl (AZO STANDARD MAXIMUM STRENGTH PO) Take 2 tablets by mouth daily as needed (urinary pain).   Yes [provider]  cephALEXin (KEFLEX) 500 MG capsule Take 1 capsule (500 mg total) by mouth 4 (four) times daily. 04/15/18   Nat Christen, MD  polyethylene glycol-electrolytes (TRILYTE) 420 g solution Take 4,000 mLs by mouth as directed. Patient not taking: Reported on 04/15/2018 01/07/18   Annitta Needs, NP    Family History Family History  Problem Relation Age of Onset  . Heart disease Father        Died age 67  . Colon cancer Paternal Aunt        Greater than age of 33  . Heart disease Paternal Uncle   . Liver disease Neg Hx     Social History Social History   Tobacco Use  . Smoking status: Former Smoker    Types: Cigarettes    Last attempt to quit: 01/31/2008    Years since quitting: 10.2  . Smokeless tobacco: Former Systems developer    Quit date: 05/17/2008  Substance Use Topics  . Alcohol use: No    Alcohol/week: 0.0 standard drinks  . Drug use: No     Allergies   Patient has no known allergies.   Review of Systems  Review of Systems  All other systems reviewed and are negative.    Physical Exam Updated Vital Signs BP 103/83 (BP Location: Right Arm)   Pulse 83   Temp 98.1 F (36.7 C) (Oral)   Resp 18   Ht 5' 6"  (1.676 m)   Wt 79.8 kg   SpO2 97%   BMI 28.41 kg/m   Physical Exam Vitals signs and nursing note reviewed.  Constitutional:      Appearance: He is well-developed.  HENT:     Head: Normocephalic and atraumatic.  Eyes:     Conjunctiva/sclera: Conjunctivae normal.  Neck:     Musculoskeletal: Neck supple.  Cardiovascular:     Rate and Rhythm: Normal rate and regular rhythm.  Pulmonary:     Effort: Pulmonary effort is normal.     Breath sounds: Normal breath sounds.  Abdominal:     General: Bowel  sounds are normal.     Palpations: Abdomen is soft.  Genitourinary:    Comments: Left flank tenderness. Musculoskeletal: Normal range of motion.  Skin:    General: Skin is warm and dry.  Neurological:     Mental Status: He is alert and oriented to person, place, and time.  Psychiatric:        Behavior: Behavior normal.      ED Treatments / Results  Labs (all labs ordered are listed, but only abnormal results are displayed) Labs Reviewed  CBC WITH DIFFERENTIAL/PLATELET - Abnormal; Notable for the following components:      Result Value   WBC 21.8 (*)    Neutro Abs 18.4 (*)    Monocytes Absolute 1.7 (*)    Abs Immature Granulocytes 0.55 (*)    All other components within normal limits  BASIC METABOLIC PANEL - Abnormal; Notable for the following components:   Potassium 3.3 (*)    CO2 21 (*)    Glucose, Bld 123 (*)    Calcium 8.4 (*)    All other components within normal limits  URINALYSIS, ROUTINE W REFLEX MICROSCOPIC - Abnormal; Notable for the following components:   Color, Urine AMBER (*)    APPearance HAZY (*)    Hgb urine dipstick MODERATE (*)    Protein, ur 100 (*)    Nitrite POSITIVE (*)    WBC, UA >50 (*)    Bacteria, UA MANY (*)    Non Squamous Epithelial 0-5 (*)    All other components within normal limits  CULTURE, BLOOD (ROUTINE X 2)  CULTURE, BLOOD (ROUTINE X 2)  URINE CULTURE    EKG None  Radiology Ct Renal Stone Study  Result Date: 04/15/2018 CLINICAL DATA:  Patient c/o left flank pain that started yesterday. Began vomiting today. Family reports some disorientation and a fall today. Febrile at home. Remote h/o renal stones, EXAM: CT ABDOMEN AND PELVIS WITHOUT CONTRAST TECHNIQUE: Multidetector CT imaging of the abdomen and pelvis was performed following the standard protocol without IV contrast. COMPARISON:  None. FINDINGS: Lower chest: No acute abnormality. Hepatobiliary: Decreased attenuation of the liver consistent with fatty infiltration. There are  several low-density liver masses, largest in the anterior aspect of segment 4, measuring 2.2 cm. These are all consistent with cysts. No other liver masses or lesions. Normal gallbladder. No bile duct dilation. Pancreas: Unremarkable. No pancreatic ductal dilatation or surrounding inflammatory changes. Spleen: Normal in size without focal abnormality. Adrenals/Urinary Tract: No adrenal masses. 14 mm low-density mass, average Hounsfield units of 8, arises from the left kidney upper pole consistent with a cyst.  No other renal masses. Tiny nonobstructing stone in the left kidney, mid upper pole. No other intrarenal stones. No hydronephrosis. Bilateral perinephric stranding, presumed chronic. Ureters are normal in course and in caliber. Bladder is decompressed. Stomach/Bowel: Stomach and small bowel are unremarkable. Colon is normal in caliber. There are multiple diverticula mostly along the sigmoid colon. No diverticulitis, wall thickening or other colon inflammatory process. Normal appendix visualized. Vascular/Lymphatic: Aortic atherosclerosis. No enlarged lymph nodes. Reproductive: Enlarged prostate measuring 5.4 x 4.2 x 5.4 cm. Other: No abdominal wall hernia or abnormality. No abdominopelvic ascites. Musculoskeletal: No fracture or acute finding. No osteoblastic or osteolytic lesions. IMPRESSION: 1. No acute findings.  No findings to account for left flank pain. 2. Tiny nonobstructing stone in the left kidney. Small left upper pole renal cyst. 3. Hepatic steatosis.  Multiple liver cysts. 4. Colonic diverticula mostly along the sigmoid colon. No diverticulitis. 5. Aortic atherosclerosis. Electronically Signed   By: Lajean Manes M.D.   On: 04/15/2018 20:00    Procedures Procedures (including critical care time)  Medications Ordered in ED Medications  acetaminophen (TYLENOL) tablet 650 mg (650 mg Oral Given 04/15/18 1812)  sodium chloride 0.9 % bolus 1,000 mL (0 mLs Intravenous Stopped 04/15/18 2229)   cefTRIAXone (ROCEPHIN) 2 g in sodium chloride 0.9 % 100 mL IVPB (0 g Intravenous Stopped 04/15/18 2048)  cephALEXin (KEFLEX) capsule 500 mg (500 mg Oral Given by Other 04/15/18 2242)     Initial Impression / Assessment and Plan / ED Course  I have reviewed the triage vital signs and the nursing notes.  Pertinent labs & imaging results that were available during my care of the patient were reviewed by me and considered in my medical decision making (see chart for details).        History and physical consistent with pyelonephritis versus kidney stone.  Urinalysis shows obvious infection.  CT scan reveals no acute findings.  Patient feels much better after IV fluids and to grams of Rocephin IV.  Will discharge home with Keflex 500 mg 4 times daily for 1 week.  Patient understands to return if worse in any way.  Final Clinical Impressions(s) / ED Diagnoses   Final diagnoses:  Pyelonephritis of left kidney    ED Discharge Orders         Ordered    cephALEXin (KEFLEX) 500 MG capsule  4 times daily     04/15/18 2230           Nat Christen, MD 04/15/18 2340

## 2018-04-15 NOTE — ED Triage Notes (Signed)
Patient reports flank pain that started yesterday. Began vomiting today. Family reports some disorientation and a fall today. Febrile at home.

## 2018-04-15 NOTE — Discharge Instructions (Addendum)
You have a kidney infection.  No kidney stones.  Increase fluids.  Tylenol for fever.  Rest.  Return if worse.

## 2018-04-18 LAB — URINE CULTURE: Culture: >=100000 — AB

## 2018-04-19 ENCOUNTER — Telehealth: Payer: Self-pay | Admitting: *Deleted

## 2018-04-19 NOTE — Telephone Encounter (Signed)
Post ED Visit - Positive Culture Follow-up  Culture report reviewed by antimicrobial stewardship pharmacist: [place] Team []  Elenor Quinones, Pharm.D. []  Heide Guile, Pharm.D., BCPS AQ-ID []  Parks Neptune, Pharm.D., BCPS []  Alycia Rossetti, Pharm.D., BCPS []  Newell, Pharm.D., BCPS, AAHIVP []  Legrand Como, Pharm.D., BCPS, AAHIVP []  Salome Arnt, PharmD, BCPS []  Johnnette Gourd, PharmD, BCPS []  Hughes Better, PharmD, BCPS []  Leeroy Cha, PharmD [x]  Laqueta Linden, PharmD, BCPS []  Albertina Parr, PharmD  [place] Team []  Leodis Sias, PharmD []  Lindell Spar, PharmD []  Royetta Asal, PharmD []  Graylin Shiver, Rph []  Rema Fendt) Glennon Mac, PharmD []  Arlyn Dunning, PharmD []  Netta Cedars, PharmD []  Dia Sitter, PharmD []  Leone Haven, PharmD []  Gretta Arab, PharmD []  Theodis Shove, PharmD []  Peggyann Juba, PharmD []  Reuel Boom, PharmD   Positive urine culture Treated with Cephalexin, organism sensitive to the same and no further patient follow-up is required at this time.  Miguel Morrow 04/19/2018, 1:23 PM

## 2018-04-20 LAB — CULTURE, BLOOD (ROUTINE X 2)
Culture: NO GROWTH
Culture: NO GROWTH
Special Requests: ADEQUATE
Special Requests: ADEQUATE

## 2018-05-20 ENCOUNTER — Other Ambulatory Visit: Payer: Self-pay

## 2018-05-20 ENCOUNTER — Ambulatory Visit (INDEPENDENT_AMBULATORY_CARE_PROVIDER_SITE_OTHER): Payer: Medicare HMO | Admitting: Family Medicine

## 2018-05-20 ENCOUNTER — Encounter: Payer: Self-pay | Admitting: Family Medicine

## 2018-05-20 DIAGNOSIS — R110 Nausea: Secondary | ICD-10-CM | POA: Diagnosis not present

## 2018-05-20 DIAGNOSIS — R1030 Lower abdominal pain, unspecified: Secondary | ICD-10-CM | POA: Diagnosis not present

## 2018-05-20 MED ORDER — ONDANSETRON 4 MG PO TBDP: ORAL_TABLET | ORAL | 0 refills | Status: DC

## 2018-05-20 MED ORDER — METRONIDAZOLE 500 MG PO TABS: ORAL_TABLET | ORAL | 0 refills | Status: DC

## 2018-05-20 MED ORDER — CIPROFLOXACIN HCL 500 MG PO TABS: ORAL_TABLET | ORAL | 0 refills | Status: DC

## 2018-05-20 NOTE — Progress Notes (Signed)
   Subjective:  Audio plus visual  Patient ID: Miguel Morrow, male    DOB: Jun 10, 1950, 68 y.o.   MRN: [id number]  Emesis   This is a new problem. Episode onset: End of march. Associated symptoms include abdominal pain and diarrhea. He has tried increased fluids (pepto) for the symptoms. The treatment provided mild relief.  Daughter states that pt went to beach at end of March. Pt began to have UTIs but did not say anything because he did not want to ruin the vacation. Pt daughter states that they took pt to ER in early April and was diagnosed with kidney infection. Began to feel better after antibiotics. Pt began to have spouts of pain in RLQ, certain movements would cause pain, did not have pain at all times. Easter Sunday daughter states that patient had been vomiting, diarrhea for one week. Daughter gave Pepto. Next few days he felt ok. No normal BM in 2 weeks. After eating this morning, about 45 minutes, pt began to dry heave and then vomited.   Virtual Visit via Video Note  I connected with Miguel Morrow on 05/20/18 at  2:30 PM EDT by a video enabled telemedicine application and verified that I am speaking with the correct person using two identifiers.   I discussed the limitations of evaluation and management by telemedicine and the availability of in person appointments. The patient expressed understanding and agreed to proceed.  History of Present Illness:    Observations/Objective:   Assessment and Plan:   Follow Up Instructions:    I discussed the assessment and treatment plan with the patient. The patient was provided an opportunity to ask questions and all were answered. The patient agreed with the plan and demonstrated an understanding of the instructions.   The patient was advised to call back or seek an in-person evaluation if the symptoms worsen or if the condition fails to improve as anticipated.  I provided 25 minutes of non-face-to-face time during this encounter.   Complete emergency room report of recent pyelonephritis reviewed.  Had greater than 100,000 E. coli.  Felt to have true pyelonephritis.  Is continue to have ongoing symptoms.  Now mostly low abdominal discomfort.  With nausea.  And an element of anorexia.  Patient still has his gallbladder.  Patient still has his appendix.  Appetite not the best.  Had a colonoscopy earlier this year results reviewed.  Had multiple polyps along with evident diverticulosis [name], LPN     Review of Systems  Gastrointestinal: Positive for abdominal pain, diarrhea and vomiting.       Objective:   Physical Exam  Virtual visit      Assessment & Plan:  Impression low abdominal pain.  Mostly right lower quadrant per history.  Accompanied by nausea.  No obvious fever.  Positive element of anorexia.  Known diverticulosis.  No history of diverticulitis.  Also recent urinary tract infection.  No dysuria.  Patient at home today so will cover with antibiotics and antinausea medicine.  Patient to follow-up tomorrow for assessment and abdominal exam within the office setting

## 2018-05-21 ENCOUNTER — Encounter (HOSPITAL_COMMUNITY): Payer: Self-pay | Admitting: Emergency Medicine

## 2018-05-21 ENCOUNTER — Ambulatory Visit (INDEPENDENT_AMBULATORY_CARE_PROVIDER_SITE_OTHER): Payer: Medicare HMO | Admitting: Family Medicine

## 2018-05-21 ENCOUNTER — Other Ambulatory Visit (HOSPITAL_COMMUNITY)
Admission: RE | Admit: 2018-05-21 | Discharge: 2018-05-21 | Disposition: A | Discharge status: Home / Self Care | Payer: Medicare HMO | Source: Ambulatory Visit | Attending: Family Medicine | Admitting: Family Medicine

## 2018-05-21 ENCOUNTER — Encounter: Payer: Self-pay | Admitting: Family Medicine

## 2018-05-21 ENCOUNTER — Ambulatory Visit (HOSPITAL_COMMUNITY)
Admission: RE | Admit: 2018-05-21 | Discharge: 2018-05-21 | Disposition: A | Discharge status: Home / Self Care | Payer: Medicare HMO | Source: Ambulatory Visit | Attending: Family Medicine | Admitting: Family Medicine

## 2018-05-21 ENCOUNTER — Other Ambulatory Visit: Payer: Self-pay

## 2018-05-21 ENCOUNTER — Observation Stay (HOSPITAL_COMMUNITY)
Admission: EM | Admit: 2018-05-21 | Discharge: 2018-05-22 | Disposition: A | Discharge status: Home / Self Care | Payer: Medicare HMO | Attending: Family Medicine | Admitting: Family Medicine

## 2018-05-21 VITALS — BP 130/88 | Temp 97.9°F | Wt 169.2 lb

## 2018-05-21 DIAGNOSIS — Z87891 Personal history of nicotine dependence: Secondary | ICD-10-CM | POA: Diagnosis not present

## 2018-05-21 DIAGNOSIS — R194 Change in bowel habit: Secondary | ICD-10-CM | POA: Diagnosis present

## 2018-05-21 DIAGNOSIS — E785 Hyperlipidemia, unspecified: Secondary | ICD-10-CM | POA: Insufficient documentation

## 2018-05-21 DIAGNOSIS — K5100 Ulcerative (chronic) pancolitis without complications: Principal | ICD-10-CM | POA: Insufficient documentation

## 2018-05-21 DIAGNOSIS — R001 Bradycardia, unspecified: Secondary | ICD-10-CM | POA: Diagnosis not present

## 2018-05-21 DIAGNOSIS — I10 Essential (primary) hypertension: Secondary | ICD-10-CM | POA: Diagnosis not present

## 2018-05-21 DIAGNOSIS — R1031 Right lower quadrant pain: Secondary | ICD-10-CM

## 2018-05-21 DIAGNOSIS — R140 Abdominal distension (gaseous): Secondary | ICD-10-CM | POA: Diagnosis present

## 2018-05-21 DIAGNOSIS — K51012 Ulcerative (chronic) pancolitis with intestinal obstruction: Secondary | ICD-10-CM | POA: Diagnosis not present

## 2018-05-21 DIAGNOSIS — Z7982 Long term (current) use of aspirin: Secondary | ICD-10-CM | POA: Diagnosis not present

## 2018-05-21 DIAGNOSIS — R531 Weakness: Secondary | ICD-10-CM | POA: Diagnosis not present

## 2018-05-21 DIAGNOSIS — Z79899 Other long term (current) drug therapy: Secondary | ICD-10-CM | POA: Diagnosis not present

## 2018-05-21 DIAGNOSIS — E782 Mixed hyperlipidemia: Secondary | ICD-10-CM | POA: Diagnosis present

## 2018-05-21 DIAGNOSIS — K529 Noninfective gastroenteritis and colitis, unspecified: Secondary | ICD-10-CM | POA: Diagnosis present

## 2018-05-21 LAB — BASIC METABOLIC PANEL
Anion gap: 7 (ref 5–15)
BUN: 14 mg/dL (ref 8–23)
CO2: 24 mmol/L (ref 22–32)
Calcium: 8.2 mg/dL — ABNORMAL LOW (ref 8.9–10.3)
Chloride: 107 mmol/L (ref 98–111)
Creatinine, Ser: 1 mg/dL (ref 0.61–1.24)
GFR calc Af Amer: >60 mL/min (ref >60)
GFR calc non Af Amer: >60 mL/min (ref >60)
Glucose, Bld: 106 mg/dL — ABNORMAL HIGH (ref 70–99)
Potassium: 3.5 mmol/L (ref 3.5–5.1)
Sodium: 138 mmol/L (ref 135–145)

## 2018-05-21 LAB — C DIFFICILE QUICK SCREEN W PCR REFLEX
C Diff antigen: NEGATIVE
C Diff interpretation: NOT DETECTED
C Diff toxin: NEGATIVE

## 2018-05-21 LAB — CBC WITH DIFFERENTIAL/PLATELET
Abs Immature Granulocytes: 0.18 10*3/uL — ABNORMAL HIGH (ref 0.00–0.07)
Basophils Absolute: 0.1 10*3/uL (ref 0.0–0.1)
Basophils Relative: 1 %
Eosinophils Absolute: 0.2 10*3/uL (ref 0.0–0.5)
Eosinophils Relative: 1 %
HCT: 48.7 % (ref 39.0–52.0)
Hemoglobin: 15.4 g/dL (ref 13.0–17.0)
Immature Granulocytes: 1 %
Lymphocytes Relative: 13 %
Lymphs Abs: 2.9 10*3/uL (ref 0.7–4.0)
MCH: 29.1 pg (ref 26.0–34.0)
MCHC: 31.6 g/dL (ref 30.0–36.0)
MCV: 91.9 fL (ref 80.0–100.0)
Monocytes Absolute: 1.4 10*3/uL — ABNORMAL HIGH (ref 0.1–1.0)
Monocytes Relative: 6 %
Neutro Abs: 17.4 10*3/uL — ABNORMAL HIGH (ref 1.7–7.7)
Neutrophils Relative %: 78 %
Platelets: 342 10*3/uL (ref 150–400)
RBC: 5.3 MIL/uL (ref 4.22–5.81)
RDW: 14.5 % (ref 11.5–15.5)
WBC: 22.2 10*3/uL — ABNORMAL HIGH (ref 4.0–10.5)
nRBC: 0 % (ref 0.0–0.2)

## 2018-05-21 LAB — POCT URINALYSIS DIPSTICK
Protein, UA: POSITIVE — AB
Spec Grav, UA: >=1.03 — AB (ref 1.010–1.025)
pH, UA: 6 (ref 5.0–8.0)

## 2018-05-21 LAB — HEPATIC FUNCTION PANEL
ALT: 21 U/L (ref 0–44)
AST: 19 U/L (ref 15–41)
Albumin: 3.2 g/dL — ABNORMAL LOW (ref 3.5–5.0)
Alkaline Phosphatase: 38 U/L (ref 38–126)
Bilirubin, Direct: 0.1 mg/dL (ref 0.0–0.2)
Indirect Bilirubin: 0.4 mg/dL (ref 0.3–0.9)
Total Bilirubin: 0.5 mg/dL (ref 0.3–1.2)
Total Protein: 6 g/dL — ABNORMAL LOW (ref 6.5–8.1)

## 2018-05-21 LAB — MAGNESIUM: Magnesium: 2.1 mg/dL (ref 1.7–2.4)

## 2018-05-21 MED ORDER — IOHEXOL 300 MG/ML  SOLN
50.0000 mL | Freq: Once | INTRAMUSCULAR | Status: AC | PRN
  Administered 2018-05-21: 14:00:00 30 mL via ORAL

## 2018-05-21 MED ORDER — MORPHINE SULFATE (PF) 2 MG/ML IV SOLN: 2.0000 mg | INTRAVENOUS | Status: DC | PRN

## 2018-05-21 MED ORDER — IOHEXOL 300 MG/ML  SOLN
100.0000 mL | Freq: Once | INTRAMUSCULAR | Status: AC | PRN
  Administered 2018-05-21: 100 mL via INTRAVENOUS

## 2018-05-21 MED ORDER — SODIUM CHLORIDE 0.9 % IV SOLN
INTRAVENOUS | Status: DC
  Administered 2018-05-21: 16:00:00 via INTRAVENOUS

## 2018-05-21 MED ORDER — ENOXAPARIN SODIUM 40 MG/0.4ML ~~LOC~~ SOLN
40.0000 mg | SUBCUTANEOUS | Status: DC
  Administered 2018-05-21: 40 mg via SUBCUTANEOUS
  Filled 2018-05-21 (×2): qty 0.4

## 2018-05-21 MED ORDER — HYDRALAZINE HCL 20 MG/ML IJ SOLN
10.0000 mg | INTRAMUSCULAR | Status: DC | PRN
  Filled 2018-05-21: qty 1

## 2018-05-21 MED ORDER — ASPIRIN 81 MG PO CHEW
81.0000 mg | CHEWABLE_TABLET | Freq: Every day | ORAL | Status: DC
  Administered 2018-05-22: 81 mg via ORAL
  Filled 2018-05-21: qty 1

## 2018-05-21 MED ORDER — KCL IN DEXTROSE-NACL 20-5-0.9 MEQ/L-%-% IV SOLN
INTRAVENOUS | Status: DC
  Administered 2018-05-21: 19:00:00 via INTRAVENOUS

## 2018-05-21 MED ORDER — ONDANSETRON HCL 4 MG/2ML IJ SOLN
4.0000 mg | Freq: Four times a day (QID) | INTRAMUSCULAR | Status: DC | PRN
  Administered 2018-05-21: 4 mg via INTRAVENOUS
  Filled 2018-05-21: qty 2

## 2018-05-21 MED ORDER — SODIUM CHLORIDE 0.9 % IV BOLUS
500.0000 mL | Freq: Once | INTRAVENOUS | Status: AC
  Administered 2018-05-21: 500 mL via INTRAVENOUS

## 2018-05-21 MED ORDER — CIPROFLOXACIN IN D5W 400 MG/200ML IV SOLN
400.0000 mg | Freq: Two times a day (BID) | INTRAVENOUS | Status: DC
  Administered 2018-05-21 – 2018-05-22 (×2): 400 mg via INTRAVENOUS
  Filled 2018-05-21 (×2): qty 200

## 2018-05-21 MED ORDER — METRONIDAZOLE IN NACL 5-0.79 MG/ML-% IV SOLN
500.0000 mg | Freq: Three times a day (TID) | INTRAVENOUS | Status: DC
  Administered 2018-05-21 – 2018-05-22 (×3): 500 mg via INTRAVENOUS
  Filled 2018-05-21 (×3): qty 100

## 2018-05-21 NOTE — ED Triage Notes (Signed)
Pt was sent by PCP for "inflamed colon".  Pt had the CT scan for abdominal pain and was told to come to ER for eval

## 2018-05-21 NOTE — ED Provider Notes (Signed)
[facility] EMERGENCY DEPARTMENT Provider Note   CSN: [id number] Arrival date & time: 05/21/18  1531    History   Chief Complaint Chief Complaint  Patient presents with  . Abdominal Pain  . Diarrhea    HPI Miguel Morrow is a 68 y.o. male.     HPI Patient presents with about 1 week of abdominal discomfort, nausea, vomiting, loose stool. Patient states that he is generally well. He did have urinary tract infection about 1 month ago, and completed a course of antibiotics. In the interval he was asymptomatic, but about 1 week ago developed having diffuse abdominal bloating, pain, described as tightness. There is associated anorexia, nausea, which have become more persistent. Patient notes that attempts at an oral intake resulted in liquidy stool soon thereafter. No fever, no confusion, disorientation, chest pain, dyspnea. He has been working with his physician, and today went for CT scan. Findings reportedly were concerning for pancolitis.  He was sent here for evaluation. Past Medical History:  Diagnosis Date  . Bradycardia   . Nephrolithiasis 1984    Patient Active Problem List   Diagnosis Date Noted  . Essential hypertension 10/31/2016  . Osteoarthritis of right knee 02/09/2014  . Ulnar neuropathy at elbow of left upper extremity 02/09/2014  . Rash and nonspecific skin eruption 02/09/2014  . Abnormal cardiovascular stress test 11/21/2012  . Precordial pain 10/22/2012  . Bradycardia 10/03/2012  . Hemorrhoid 09/24/2012  . Bowel habit changes 09/24/2012  . Mixed hyperlipidemia 08/20/2012    Past Surgical History:  Procedure Laterality Date  . COLONOSCOPY N/A 09/26/2012   Procedure: COLONOSCOPY;  Surgeon: Daneil Dolin, MD;  [facility];  Service: Endoscopy;  Laterality: N/A;  1:45  . COLONOSCOPY N/A 03/13/2018   Procedure: COLONOSCOPY;  Surgeon: Daneil Dolin, MD;  [facility];  Service: Endoscopy;  Laterality: N/A;  12:00  . Kidney stone  basket extraction  1984  . POLYPECTOMY  03/13/2018   Procedure: POLYPECTOMY;  Surgeon: Daneil Dolin, MD;  [facility];  Service: Endoscopy;;  colon        Home Medications    Prior to Admission medications   Medication Sig Start Date End Date Taking? Authorizing Provider  aspirin 81 MG tablet Take 81 mg by mouth daily.    [provider]  cephALEXin (KEFLEX) 500 MG capsule Take 1 capsule (500 mg total) by mouth 4 (four) times daily. 04/15/18   Nat Christen, MD  ciprofloxacin (CIPRO) 500 MG tablet Take one tablet by mouth twice daily for 10 days 05/20/18   Mikey Kirschner, MD  ibuprofen (ADVIL,MOTRIN) 200 MG tablet Take 200 mg by mouth 2 (two) times daily as needed for headache or moderate pain.    [provider]  loratadine (CLARITIN) 10 MG tablet Take 10 mg by mouth daily as needed for allergies.    [provider]  metroNIDAZOLE (FLAGYL) 500 MG tablet Take one tablet by mouth three times daily for 10 days Patient not taking: Reported on 05/21/2018 05/20/18   Mikey Kirschner, MD  ondansetron (ZOFRAN ODT) 4 MG disintegrating tablet Dissolve one under toungue every 6 hours prn nausea. 05/20/18   Mikey Kirschner, MD  Phenazopyridine HCl (AZO STANDARD MAXIMUM STRENGTH PO) Take 2 tablets by mouth daily as needed (urinary pain).    [provider]  polyethylene glycol-electrolytes (TRILYTE) 420 g solution Take 4,000 mLs by mouth as directed. Patient not taking: Reported on 04/15/2018 01/07/18   Cyndi Bender,  Leandra Kern, NP    Family History Family History  Problem Relation Age of Onset  . Heart disease Father        Died age 75  . Colon cancer Paternal Aunt        Greater than age of 82  . Heart disease Paternal Uncle   . Liver disease Neg Hx     Social History Social History   Tobacco Use  . Smoking status: Former Smoker    Types: Cigarettes    Last attempt to quit: 01/31/2008    Years since quitting: 10.3  . Smokeless tobacco: Former Systems developer     Quit date: 05/17/2008  Substance Use Topics  . Alcohol use: No    Alcohol/week: 0.0 standard drinks  . Drug use: No     Allergies   Patient has no known allergies.   Review of Systems Review of Systems  Constitutional:       Per HPI, otherwise negative  HENT:       Per HPI, otherwise negative  Respiratory:       Per HPI, otherwise negative  Cardiovascular:       Per HPI, otherwise negative  Gastrointestinal: Positive for abdominal pain and diarrhea. Negative for vomiting.  Endocrine:       Negative aside from HPI  Genitourinary:       Neg aside from HPI   Musculoskeletal:       Per HPI, otherwise negative  Skin: Negative.   Neurological: Positive for weakness. Negative for syncope.     Physical Exam Updated Vital Signs BP (!) 183/81 (BP Location: Right Arm)   Pulse (!) 58   Temp 97.9 F (36.6 C) (Oral)   Resp 18   Ht 5' 6"  (1.676 m)   Wt 76.7 kg   SpO2 99%   BMI 27.28 kg/m   Physical Exam Vitals signs and nursing note reviewed.  Constitutional:      General: He is not in acute distress.    Appearance: He is well-developed.  HENT:     Head: Normocephalic and atraumatic.  Eyes:     Conjunctiva/sclera: Conjunctivae normal.  Cardiovascular:     Rate and Rhythm: Normal rate and regular rhythm.  Pulmonary:     Effort: Pulmonary effort is normal. No respiratory distress.     Breath sounds: No stridor.  Abdominal:     General: There is no distension.     Tenderness: There is generalized abdominal tenderness.  Skin:    General: Skin is warm and dry.  Neurological:     Mental Status: He is alert and oriented to person, place, and time.      ED Treatments / Results  Labs (all labs ordered are listed, but only abnormal results are displayed) Labs Reviewed  C DIFFICILE QUICK SCREEN W PCR REFLEX   Patient had lab work performed earlier today.  Findings most notable for leukocytosis, 22,000.   Radiology Ct Abdomen Pelvis W Contrast  Result  Date: 05/21/2018 CLINICAL DATA:  Intermittent right lower quadrant pain for 6 days. Question appendicitis. EXAM: CT ABDOMEN AND PELVIS WITH CONTRAST TECHNIQUE: Multidetector CT imaging of the abdomen and pelvis was performed using the standard protocol following bolus administration of intravenous contrast. CONTRAST:  100 mL OMNIPAQUE IOHEXOL 300 MG/ML COMPARISON:  CT abdomen and pelvis 04/15/2018. FINDINGS: Lower chest: No pleural or pericardial effusion. Heart size is normal. Lung bases are clear. Hepatobiliary: Scattered cysts in the liver are unchanged. Fatty infiltration of the liver is noted.  No focal lesion. The gallbladder and biliary tree are unremarkable. Pancreas: Unremarkable. No pancreatic ductal dilatation or surrounding inflammatory changes. Spleen: Normal in size without focal abnormality. Adrenals/Urinary Tract: 2 small right renal cysts are seen. The kidneys are otherwise unremarkable. Small stone in the left kidney described on report of the prior exam is not visible on this study. Urinary bladder appears normal. The adrenal glands are unremarkable. Stomach/Bowel: There is thickening of the walls of the colon throughout. Stranding about the colon is most notable along the ascending colon. No pneumatosis, portal venous gas or free intraperitoneal air is identified. No focal fluid collection. The stomach, small bowel and appendix are unremarkable. Vascular/Lymphatic: Aortic atherosclerosis. No enlarged abdominal or pelvic lymph nodes. Reproductive: Prostate is unremarkable. Other: None. Musculoskeletal: No acute or focal bony abnormality. IMPRESSION: The examination is positive for infectious or inflammatory pancolitis. Fatty infiltration of the liver and multiple hepatic cysts. Atherosclerosis. Electronically Signed   By: Inge Rise M.D.   On: 05/21/2018 15:10    Procedures Procedures (including critical care time)  Medications Ordered in ED Medications  0.9 %  sodium chloride  infusion (has no administration in time range)  ondansetron (ZOFRAN) injection 4 mg (has no administration in time range)     Initial Impression / Assessment and Plan / ED Course  I have reviewed the triage vital signs and the nursing notes.  Pertinent labs & imaging results that were available during my care of the patient were reviewed by me and considered in my medical decision making (see chart for details).    Chart review notable for colonoscopy March 13, 2018, results below: Six 4 to 6 mm polyps in the descending colon, in the ascending colon and in the cecum, removed with a cold snare. Resected and retrieved. - Diverticulosis in the sigmoid colon and in the descending colon. - The examination was otherwise normal on direct and retroflexion views. Pathology reports negative for high-grade dysplastic lesions.    This elderly male presents with ongoing abdominal pain, bloating sensation.  On exam he is found to have a tender abdomen, but is hemodynamically unremarkable.  CT scan performed prior to ED arrival, and labs notable for leukocytosis, pancolitis Patient did have recent urinary tract infection, with antibiotics, and C. difficile screen is pending. With concern for pancolitis, the patient was started on antibiotics, admitted for further monitoring, management. Final Clinical Impressions(s) / ED Diagnoses   Final diagnoses:  Pancolitis [facility])     Carmin Muskrat, MD 05/21/18 1754

## 2018-05-21 NOTE — Progress Notes (Signed)
   Subjective:    Patient ID: Miguel Morrow, male    DOB: 05/10/1950, 68 y.o.   MRN: [id number]  HPI Pt here today for provider to look at RLQ. Pt had phone video visit yesterday. Pt has had diarrhea with nausea for about 6 days. Pain in RLQ. No appetite for 4 days. Is able to keep liquids down. Pt did have kidney infection back in March. Pt has traveled to [place] in March.  Patient had fairly painful right lower quadrant last several days.  Diminished appetite also.  Perhaps slight chills.  Noticing fever.  Colonoscopy within the last several years  No major urinary symptoms.  Pain bad enough to affect his sleep at night.   Review of Systems No rash no headache no chest pain    Objective:   Physical Exam  Alert and oriented, vitals reviewed and stable, NAD ENT-TM's and ext canals WNL bilat via otoscopic exam Soft palate, tonsils and post pharynx WNL via oropharyngeal exam Neck-symmetric, no masses; thyroid nonpalpable and nontender Pulmonary-no tachypnea or accessory muscle use; Clear without wheezes via auscultation Card--no abnrml murmurs, rhythm reg and rate WNL Carotid pulses symmetric, without bruits Abdominal exam distinct low abdominal tenderness diffuse in nature      Assessment & Plan:  Impression concerning features with substantial abdominal pain.  Progression of symptoms to warning signs.  Discussed at length.  Sent for urgent blood work and scan.  Scan revealed pancolitis.  White blood count up tremendously.  Patient advised to get over to the emergency room.  We spoke with the ER doctor.

## 2018-05-21 NOTE — H&P (Addendum)
History and Physical    Miguel Morrow WEX:[id number] DOB: 28-Nov-1950 DOA: 05/21/2018  PCP: Mikey Kirschner, MD   Patient coming from: Home  I have personally briefly reviewed patient's old medical records in [place]  Chief Complaint: Abdominal Pain, diarrhea  HPI: Miguel Morrow is a 68 y.o. male with medical history significant for hypertension, kidney stones to the ED with complaints of abdominal pain and loose stool of 1 week duration.  Abdominal pain is diffuse.  Patient reports multiple episodes of watery stools initially every 40 minutes now about every 2 hours, no blood.  Reports some nausea without vomiting. Patient denied fever or chills, no sore throat, no flulike symptoms, no difficulty breathing or cough, no sick contacts, and has been following lockdown restrictions.  It was in the ED 04/15/2018 with flank pain diagnosed with urinary tract infection, given IV Rocephin and IV fluids, discharged on a week course of QID Keflex.  Symptoms started about 10 days after.  Called his primary care provider, CT with contrast was obtained, which showed infectious or inflammatory pancolitis.  Patient was referred to ED.  ED Course:.  Elevated blood pressure systolic 938B to 017P, temperature 97.9.  Marked leukocytosis at 22, Cr about baseline at 1.  Stool C. difficile obtained and pending.  UA shows moderate leukocytes.  Hospitalist called to admit for colitis.  Review of Systems: As per HPI all other systems reviewed and negative.  Past Medical History:  Diagnosis Date   Bradycardia    Nephrolithiasis 1984    Past Surgical History:  Procedure Laterality Date   COLONOSCOPY N/A 09/26/2012   Procedure: COLONOSCOPY;  Surgeon: Daneil Dolin, MD;  [facility];  Service: Endoscopy;  Laterality: N/A;  1:45   COLONOSCOPY N/A 03/13/2018   Procedure: COLONOSCOPY;  Surgeon: Daneil Dolin, MD;  [facility];  Service: Endoscopy;  Laterality: N/A;  12:00    Kidney stone basket extraction  1984   POLYPECTOMY  03/13/2018   Procedure: POLYPECTOMY;  Surgeon: Daneil Dolin, MD;  [facility];  Service: Endoscopy;;  colon     reports that he quit smoking about 10 years ago. His smoking use included cigarettes. He quit smokeless tobacco use about 10 years ago. He reports that he does not drink alcohol or use drugs.  No Known Allergies  Family History  Problem Relation Age of Onset   Heart disease Father        Died age 53   Colon cancer Paternal 23        Greater than age of 21   Heart disease Paternal Uncle    Liver disease Neg Hx     Prior to Admission medications   Medication Sig Start Date End Date Taking? Authorizing Provider  aspirin 81 MG tablet Take 81 mg by mouth daily.   Yes [provider]  ciprofloxacin (CIPRO) 500 MG tablet Take one tablet by mouth twice daily for 10 days Patient taking differently: Take 500 mg by mouth 2 (two) times daily. Take one tablet by mouth twice daily for 10 days starting on 4/20 05/20/18  Yes Luking, Grace Bushy, MD  ibuprofen (ADVIL,MOTRIN) 200 MG tablet Take 200 mg by mouth 2 (two) times daily as needed for headache or moderate pain.   Yes [provider]  loratadine (CLARITIN) 10 MG tablet Take 10 mg by mouth daily as needed for allergies.   Yes [provider]  metroNIDAZOLE (FLAGYL) 500 MG tablet  Take one tablet by mouth three times daily for 10 days Patient taking differently: Take 500 mg by mouth 3 (three) times daily. Take one tablet by mouth three times daily for 10 days starting on 05/20/2018 05/20/18  Yes Mikey Kirschner, MD  ondansetron (ZOFRAN ODT) 4 MG disintegrating tablet Dissolve one under toungue every 6 hours prn nausea. Patient taking differently: Take 4 mg by mouth every 6 (six) hours as needed for nausea or vomiting.  05/20/18  Yes Mikey Kirschner, MD  psyllium (METAMUCIL) 58.6 % packet Take 1 packet by mouth daily as needed (for bloating).    Yes [provider]  cephALEXin (KEFLEX) 500 MG capsule Take 1 capsule (500 mg total) by mouth 4 (four) times daily. Patient not taking: Reported on 05/21/2018 04/15/18   Nat Christen, MD    Physical Exam: Vitals:   05/21/18 1546 05/21/18 1600  BP: (!) 183/81 (!) 177/74  Pulse: (!) 58 (!) 54  Resp: 18   Temp: 97.9 F (36.6 C)   TempSrc: Oral   SpO2: 99%   Weight: 76.7 kg   Height: 5' 6"  (1.676 m)     Constitutional: NAD, calm, comfortable Vitals:   05/21/18 1546 05/21/18 1600  BP: (!) 183/81 (!) 177/74  Pulse: (!) 58 (!) 54  Resp: 18   Temp: 97.9 F (36.6 C)   TempSrc: Oral   SpO2: 99%   Weight: 76.7 kg   Height: 5' 6"  (1.676 m)    Eyes: PERRL, lids and conjunctivae normal ENMT: Mucous membranes are moist. Posterior pharynx clear of any exudate or lesions.Normal dentition.  Neck: normal, supple, no masses, no thyromegaly Respiratory: clear to auscultation bilaterally, no wheezing, no crackles. Normal respiratory effort. No accessory muscle use.  Cardiovascular: Regular rate and rhythm, no murmurs / rubs / gallops. No extremity edema. 2+ pedal pulses.  Abdomen: Mild diffuse tenderness, no masses palpated. No hepatosplenomegaly.  Musculoskeletal: no clubbing / cyanosis. No joint deformity upper and lower extremities. Good ROM, no contractures. Normal muscle tone.  Skin: no rashes, lesions, ulcers. No induration Neurologic: CN 2-12 grossly intact. Strength 5/5 in all 4.  Psychiatric: Normal judgment and insight. Alert and oriented x 3. Normal mood.   Labs on Admission: I have personally reviewed following labs and imaging studies  CBC: Recent Labs  Lab 05/21/18 1042  WBC 22.2*  NEUTROABS 17.4*  HGB 15.4  HCT 48.7  MCV 91.9  PLT 865   Basic Metabolic Panel: Recent Labs  Lab 05/21/18 1042  NA 138  K 3.5  CL 107  CO2 24  GLUCOSE 106*  BUN 14  CREATININE 1.00  CALCIUM 8.2*   Liver Function Tests: Recent Labs  Lab 05/21/18 1042  AST 19  ALT 21    ALKPHOS 38  BILITOT 0.5  PROT 6.0*  ALBUMIN 3.2*   Urine analysis:    Component Value Date/Time   COLORURINE AMBER (A) 04/15/2018 1937   APPEARANCEUR HAZY (A) 04/15/2018 1937   LABSPEC 1.023 04/15/2018 1937   PHURINE 5.0 04/15/2018 1937   GLUCOSEU NEGATIVE 04/15/2018 1937   HGBUR MODERATE (A) 04/15/2018 1937   BILIRUBINUR + 05/21/2018 1031   KETONESUR NEGATIVE 04/15/2018 1937   PROTEINUR Positive (A) 05/21/2018 1031   PROTEINUR 100 (A) 04/15/2018 1937   NITRITE + 05/21/2018 1031   NITRITE POSITIVE (A) 04/15/2018 1937   LEUKOCYTESUR Moderate (2+) (A) 05/21/2018 1031   LEUKOCYTESUR NEGATIVE 04/15/2018 1937    Radiological Exams on Admission: Ct Abdomen Pelvis W Contrast  Result Date:  05/21/2018 CLINICAL DATA:  Intermittent right lower quadrant pain for 6 days. Question appendicitis. EXAM: CT ABDOMEN AND PELVIS WITH CONTRAST TECHNIQUE: Multidetector CT imaging of the abdomen and pelvis was performed using the standard protocol following bolus administration of intravenous contrast. CONTRAST:  100 mL OMNIPAQUE IOHEXOL 300 MG/ML COMPARISON:  CT abdomen and pelvis 04/15/2018. FINDINGS: Lower chest: No pleural or pericardial effusion. Heart size is normal. Lung bases are clear. Hepatobiliary: Scattered cysts in the liver are unchanged. Fatty infiltration of the liver is noted. No focal lesion. The gallbladder and biliary tree are unremarkable. Pancreas: Unremarkable. No pancreatic ductal dilatation or surrounding inflammatory changes. Spleen: Normal in size without focal abnormality. Adrenals/Urinary Tract: 2 small right renal cysts are seen. The kidneys are otherwise unremarkable. Small stone in the left kidney described on report of the prior exam is not visible on this study. Urinary bladder appears normal. The adrenal glands are unremarkable. Stomach/Bowel: There is thickening of the walls of the colon throughout. Stranding about the colon is most notable along the ascending colon. No  pneumatosis, portal venous gas or free intraperitoneal air is identified. No focal fluid collection. The stomach, small bowel and appendix are unremarkable. Vascular/Lymphatic: Aortic atherosclerosis. No enlarged abdominal or pelvic lymph nodes. Reproductive: Prostate is unremarkable. Other: None. Musculoskeletal: No acute or focal bony abnormality. IMPRESSION: The examination is positive for infectious or inflammatory pancolitis. Fatty infiltration of the liver and multiple hepatic cysts. Atherosclerosis. Electronically Signed   By: Inge Rise M.D.   On: 05/21/2018 15:10    EKG: None  Assessment/Plan Active Problems:   Pancolitis (HCC)  Pancolitis-diffuse abdominal pain, diarrhea, leukocytosis 22.  Recent antibiotic course for UTI-Keflex.  Stable creatinine.  Abd-pelvic CT with contrast shows infectious versus inflammatory pancolitis.  C. difficile ordered in ED negative. - 57m bolus, cont D5 n/s + 20kcl 125cc/hr  - GI pathogen panel -Bowel rest with clear liquid diet -IV morphine 2 mg PRN - CBC, BMP, Mag a.m  Hypertension-blood pressure systolic [id number] 1876O  Not on home antihypertensives. -PRN hydralazine.  Abnormal UA- denies dysuria, febrile with leukocytosis likely from pancolitis. Recent treatment for UTI with keflex. Urine cultures grew pansensitive Ecoli 3/16.  HIV as part of routine health screening   DVT prophylaxis: Lovenox Code Status: Full Family Communication: None Disposition Plan: 1-2 days Consults called: None Admission status: Obs Med surg  EBethena RoysMD [facility]  05/21/2018, 7:13 PM

## 2018-05-21 NOTE — ED Notes (Signed)
ED TO INPATIENT HANDOFF REPORT  ED Nurse Name and Phone #: Jamey Ripa [phone]  S Name/Age/Gender Miguel Morrow 68 y.o. male Room/Bed: APA12/APA12  Code Status   Code Status: Not on file  Home/SNF/Other Home Patient oriented to: self, place, time and situation Is this baseline? Yes   Triage Complete: Triage complete  Chief Complaint Dr. Wolfgang Phoenix referred  Triage Note Pt was sent by PCP for "inflamed colon".  Pt had the CT scan for abdominal pain and was told to come to ER for eval    Allergies No Known Allergies  Level of Care/Admitting Diagnosis ED Disposition    ED Disposition Condition [place]: [facility] [[id number]]  Level of Care: Med-Surg [16]  Covid Evaluation: N/A  Diagnosis: Pancolitis [facility]) [[id number]]  Admitting Physician: Bethena Roys [phone]  Attending Physician: Bethena Roys Nessa.Cuff  PT Class (Do Not Modify): Observation [104]  PT Acc Code (Do Not Modify): Observation [10022]       B Medical/Surgery History Past Medical History:  Diagnosis Date  . Bradycardia   . Nephrolithiasis 1984   Past Surgical History:  Procedure Laterality Date  . COLONOSCOPY N/A 09/26/2012   Procedure: COLONOSCOPY;  Surgeon: Daneil Dolin, MD;  [facility];  Service: Endoscopy;  Laterality: N/A;  1:45  . COLONOSCOPY N/A 03/13/2018   Procedure: COLONOSCOPY;  Surgeon: Daneil Dolin, MD;  [facility];  Service: Endoscopy;  Laterality: N/A;  12:00  . Kidney stone basket extraction  1984  . POLYPECTOMY  03/13/2018   Procedure: POLYPECTOMY;  Surgeon: Daneil Dolin, MD;  [facility];  Service: Endoscopy;;  colon     A IV Location/Drains/Wounds Patient Lines/Drains/Airways Status   Active Line/Drains/Airways    Name:   Placement date:   Placement time:   Site:   Days:   Peripheral IV 05/21/18 Right Antecubital   05/21/18    1623    Antecubital   less than 1          Intake/Output Last 24  hours No intake or output data in the 24 hours ending 05/21/18 1816  Labs/Imaging Results for orders placed or performed during the hospital encounter of 05/21/18 (from the past 48 hour(s))  CBC with Differential/Platelet     Status: Abnormal   Collection Time: 05/21/18 10:42 AM  Result Value Ref Range   WBC 22.2 (H) 4.0 - 10.5 K/uL   RBC 5.30 4.22 - 5.81 MIL/uL   Hemoglobin 15.4 13.0 - 17.0 g/dL   HCT 48.7 39.0 - 52.0 %   MCV 91.9 80.0 - 100.0 fL   MCH 29.1 26.0 - 34.0 pg   MCHC 31.6 30.0 - 36.0 g/dL   RDW 14.5 11.5 - 15.5 %   Platelets 342 150 - 400 K/uL   nRBC 0.0 0.0 - 0.2 %   Neutrophils Relative % 78 %   Neutro Abs 17.4 (H) 1.7 - 7.7 K/uL   Lymphocytes Relative 13 %   Lymphs Abs 2.9 0.7 - 4.0 K/uL   Monocytes Relative 6 %   Monocytes Absolute 1.4 (H) 0.1 - 1.0 K/uL   Eosinophils Relative 1 %   Eosinophils Absolute 0.2 0.0 - 0.5 K/uL   Basophils Relative 1 %   Basophils Absolute 0.1 0.0 - 0.1 K/uL   Immature Granulocytes 1 %   Abs Immature Granulocytes 0.18 (H) 0.00 - 0.07 K/uL    Comment: Performed at [facility], [address], [place],  [place] [address]  Basic metabolic panel     Status: Abnormal   Collection Time: 05/21/18 10:42 AM  Result Value Ref Range   Sodium 138 135 - 145 mmol/L   Potassium 3.5 3.5 - 5.1 mmol/L   Chloride 107 98 - 111 mmol/L   CO2 24 22 - 32 mmol/L   Glucose, Bld 106 (H) 70 - 99 mg/dL   BUN 14 8 - 23 mg/dL   Creatinine, Ser 1.00 0.61 - 1.24 mg/dL   Calcium 8.2 (L) 8.9 - 10.3 mg/dL   GFR calc non Af Amer >60 >60 mL/min   GFR calc Af Amer >60 >60 mL/min   Anion gap 7 5 - 15    Comment: Performed at [facility], [address], [place], [place] [address]  Hepatic function panel     Status: Abnormal   Collection Time: 05/21/18 10:42 AM  Result Value Ref Range   Total Protein 6.0 (L) 6.5 - 8.1 g/dL   Albumin 3.2 (L) 3.5 - 5.0 g/dL   AST 19 15 - 41 U/L   ALT 21 0 - 44 U/L   Alkaline Phosphatase 38 38 - 126 U/L   Total Bilirubin 0.5 0.3  - 1.2 mg/dL   Bilirubin, Direct 0.1 0.0 - 0.2 mg/dL   Indirect Bilirubin 0.4 0.3 - 0.9 mg/dL    Comment: Performed at [facility], [address], [place], [place] [address]   Ct Abdomen Pelvis W Contrast  Result Date: 05/21/2018 CLINICAL DATA:  Intermittent right lower quadrant pain for 6 days. Question appendicitis. EXAM: CT ABDOMEN AND PELVIS WITH CONTRAST TECHNIQUE: Multidetector CT imaging of the abdomen and pelvis was performed using the standard protocol following bolus administration of intravenous contrast. CONTRAST:  100 mL OMNIPAQUE IOHEXOL 300 MG/ML COMPARISON:  CT abdomen and pelvis 04/15/2018. FINDINGS: Lower chest: No pleural or pericardial effusion. Heart size is normal. Lung bases are clear. Hepatobiliary: Scattered cysts in the liver are unchanged. Fatty infiltration of the liver is noted. No focal lesion. The gallbladder and biliary tree are unremarkable. Pancreas: Unremarkable. No pancreatic ductal dilatation or surrounding inflammatory changes. Spleen: Normal in size without focal abnormality. Adrenals/Urinary Tract: 2 small right renal cysts are seen. The kidneys are otherwise unremarkable. Small stone in the left kidney described on report of the prior exam is not visible on this study. Urinary bladder appears normal. The adrenal glands are unremarkable. Stomach/Bowel: There is thickening of the walls of the colon throughout. Stranding about the colon is most notable along the ascending colon. No pneumatosis, portal venous gas or free intraperitoneal air is identified. No focal fluid collection. The stomach, small bowel and appendix are unremarkable. Vascular/Lymphatic: Aortic atherosclerosis. No enlarged abdominal or pelvic lymph nodes. Reproductive: Prostate is unremarkable. Other: None. Musculoskeletal: No acute or focal bony abnormality. IMPRESSION: The examination is positive for infectious or inflammatory pancolitis. Fatty infiltration of the liver and multiple hepatic cysts.  Atherosclerosis. Electronically Signed   By: Inge Rise M.D.   On: 05/21/2018 15:10    Pending Labs Unresulted Labs (From admission, onward)    Start     Ordered   05/21/18 1604  C difficile quick scan w PCR reflex  (C Difficile quick screen w PCR reflex panel)  Once, for 24 hours,   R     05/21/18 1603   Signed and Held  HIV antibody (Routine Testing)  Once,   R     Signed and Held   Signed and Occupational hygienist  morning,   R     Signed and Held   Signed and Held  CBC  Tomorrow morning,   R     Signed and Held          Vitals/Pain Today's Vitals   05/21/18 1546 05/21/18 1600 05/21/18 1630 05/21/18 1800  BP: (!) 183/81 (!) 177/74 (!) 148/86 100/75  Pulse: (!) 58 (!) 54 (!) 58 60  Resp: 18     Temp: 97.9 F (36.6 C)     TempSrc: Oral     SpO2: 99%     Weight: 76.7 kg     Height: 5' 6"  (1.676 m)     PainSc: 6        Isolation Precautions Enteric precautions (UV disinfection)  Medications Medications  0.9 %  sodium chloride infusion ( Intravenous New Bag/Given 05/21/18 1626)  ondansetron (ZOFRAN) injection 4 mg (4 mg Intravenous Given 05/21/18 1627)    Mobility walks Low fall risk   Focused Assessments    R Recommendations: See Admitting Provider Note  Report given to:   Additional Note

## 2018-05-22 ENCOUNTER — Encounter (HOSPITAL_COMMUNITY): Payer: Self-pay | Admitting: Family Medicine

## 2018-05-22 ENCOUNTER — Telehealth: Payer: Self-pay | Admitting: *Deleted

## 2018-05-22 DIAGNOSIS — R194 Change in bowel habit: Secondary | ICD-10-CM | POA: Diagnosis not present

## 2018-05-22 DIAGNOSIS — E782 Mixed hyperlipidemia: Secondary | ICD-10-CM

## 2018-05-22 DIAGNOSIS — K5100 Ulcerative (chronic) pancolitis without complications: Secondary | ICD-10-CM | POA: Diagnosis not present

## 2018-05-22 DIAGNOSIS — I10 Essential (primary) hypertension: Secondary | ICD-10-CM | POA: Diagnosis not present

## 2018-05-22 LAB — CBC
HCT: 40.1 % (ref 39.0–52.0)
Hemoglobin: 12.9 g/dL — ABNORMAL LOW (ref 13.0–17.0)
MCH: 29.5 pg (ref 26.0–34.0)
MCHC: 32.2 g/dL (ref 30.0–36.0)
MCV: 91.6 fL (ref 80.0–100.0)
Platelets: 271 10*3/uL (ref 150–400)
RBC: 4.38 MIL/uL (ref 4.22–5.81)
RDW: 14.3 % (ref 11.5–15.5)
WBC: 13.5 10*3/uL — ABNORMAL HIGH (ref 4.0–10.5)
nRBC: 0 % (ref 0.0–0.2)

## 2018-05-22 LAB — GASTROINTESTINAL PANEL BY PCR, STOOL (REPLACES STOOL CULTURE)

## 2018-05-22 LAB — BASIC METABOLIC PANEL
Anion gap: 7 (ref 5–15)
BUN: 7 mg/dL — ABNORMAL LOW (ref 8–23)
CO2: 22 mmol/L (ref 22–32)
Calcium: 7.6 mg/dL — ABNORMAL LOW (ref 8.9–10.3)
Chloride: 109 mmol/L (ref 98–111)
Creatinine, Ser: 0.77 mg/dL (ref 0.61–1.24)
GFR calc Af Amer: >60 mL/min (ref >60)
GFR calc non Af Amer: >60 mL/min (ref >60)
Glucose, Bld: 88 mg/dL (ref 70–99)
Potassium: 3.3 mmol/L — ABNORMAL LOW (ref 3.5–5.1)
Sodium: 138 mmol/L (ref 135–145)

## 2018-05-22 MED ORDER — POTASSIUM CHLORIDE CRYS ER 20 MEQ PO TBCR
40.0000 meq | EXTENDED_RELEASE_TABLET | Freq: Once | ORAL | Status: AC
  Administered 2018-05-22: 40 meq via ORAL
  Filled 2018-05-22: qty 2

## 2018-05-22 NOTE — Discharge Instructions (Signed)
Please discuss starting blood pressure medication with your doctor.   Have your blood pressure rechecked with your doctor next week.    Soft-Food Eating Plan A soft-food eating plan includes foods that are safe and easy to chew and swallow. Your health care provider or dietitian can help you find foods and flavors that fit into this plan. Follow this plan until your health care provider or dietitian says it is safe to start eating other foods and food textures. What are tips for following this plan? General guidelines   Take small bites of food, or cut food into pieces about  inch or smaller. Bite-sized pieces of food are easier to chew and swallow.  Eat moist foods. Avoid overly dry foods.  Avoid foods that: ? Are difficult to swallow, such as dry, chunky, crispy, or sticky foods. ? Are difficult to chew, such as hard, tough, or stringy foods. ? Contain nuts, seeds, or fruits.  Follow instructions from your dietitian about the types of liquids that are safe for you to swallow. You may be allowed to have: ? Thick liquids only. This includes only liquids that are thicker than honey. ? Thin and thick liquids. This includes all beverages and foods that become liquid at room temperature.  To make thick liquids: ? Purchase a commercial liquid thickening powder. These are available at grocery stores and pharmacies. ? Mix the thickener into liquids according to instructions on the label. ? Purchase ready-made thickened liquids. ? Thicken soup by pureeing, straining to remove chunks, and adding flour, potato flakes, or corn starch. ? Add commercial thickener to foods that become liquid at room temperature, such as milk shakes, yogurt, ice cream, gelatin, and sherbet.  Ask your health care provider whether you need to take a fiber supplement. Cooking  Cook meats so they stay tender and moist. Use methods like braising, stewing, or baking in liquid.  Cook vegetables and fruit until they are  soft enough to be mashed with a fork.  Peel soft, fresh fruits such as peaches, nectarines, and melons.  When making soup, make sure chunks of meat and vegetables are smaller than  inch.  Reheat leftover foods slowly so that a tough crust does not form. What foods are allowed? The items listed below may not be a complete list. Talk with your dietitian about what dietary choices are best for you. Grains Breads, muffins, pancakes, or waffles moistened with syrup, jelly, or butter. Dry cereals well-moistened with milk. Moist, cooked cereals. Well-cooked pasta and rice. Vegetables All soft-cooked vegetables. Shredded lettuce. Fruits All canned and cooked fruits. Soft, peeled fresh fruits. Strawberries. Dairy Milk. Cream. Yogurt. Cottage cheese. Soft cheese without the rind. Meats and other protein foods Tender, moist ground meat, poultry, or fish. Meat cooked in gravy or sauces. Eggs. Sweets and desserts Ice cream. Milk shakes. Sherbet. Pudding. Fats and oils Butter. Margarine. Olive, canola, sunflower, and grapeseed oil. Smooth salad dressing. Smooth cream cheese. Mayonnaise. Gravy. What foods are not allowed? The items listed bemay not be a complete list. Talk with your dietitian about what dietary choices are best for you. Grains Coarse or dry cereals, such as bran, granola, and shredded wheat. Tough or chewy crusty breads, such as [place] bread or baguettes. Breads with nuts, seeds, or fruit. Vegetables All raw vegetables. Cooked corn. Cooked vegetables that are tough or stringy. Tough, crisp, fried potatoes and potato skins. Fruits Fresh fruits with skins or seeds, or both, such as apples, pears, and grapes. Stringy, high-pulp fruits, such  as papaya, pineapple, coconut, and mango. Fruit leather and all dried fruit. Dairy Yogurt with nuts or coconut. Meats and other protein foods Hard, dry sausages. Dry meat, poultry, or fish. Meats with gristle. Fish with bones. Fried meat or fish.  Lunch meat and hotdogs. Nuts and seeds. Chunky peanut butter or other nut butters. Sweets and desserts Cakes or cookies that are very dry or chewy. Desserts with dried fruit, nuts, or coconut. Fried pastries. Very rich pastries. Fats and oils Cream cheese with fruit or nuts. Salad dressings with seeds or chunks. Summary  A soft-food eating plan includes foods that are safe and easy to swallow. Generally, the foods should be soft enough to be mashed with a fork.  Avoid foods that are dry, hard to chew, crunchy, sticky, stringy, or crispy.  Ask your health care provider whether you need to thicken your liquids and if you need to take a fiber supplement. This information is not intended to replace advice given to you by your health care provider. Make sure you discuss any questions you have with your health care provider. Document Released: 04/25/2007 Document Revised: 03/21/2016 Document Reviewed: 03/21/2016 Elsevier Interactive Patient Education  2019 [employer]    Colitis  Colitis is inflammation of the colon. Colitis may last a short time (be acute), or it may last a long time (become chronic). What are the causes? This condition may be caused by:  Viruses.  Bacteria.  Reaction to medicine.  Certain autoimmune diseases such as Crohn's disease or ulcerative colitis.  Radiation treatment.  Decreased blood flow to the bowel (ischemia). What are the signs or symptoms? Symptoms of this condition include:  Watery diarrhea.  Passing bloody or tarry stool.  Pain.  Fever.  Vomiting.  Tiredness (fatigue).  Weight loss.  Bloating.  Abdominal pain.  Having fewer bowel movements than usual.  A strong and sudden urge to have a bowel movement.  Feeling like the bowel is not empty after a bowel movement. How is this diagnosed? This condition is diagnosed with a stool test or a blood test. You may also have other tests, such as:  X-rays.  CT  scan.  Colonoscopy.  Endoscopy.  Biopsy. How is this treated? Treatment for this condition depends on the cause. The condition may be treated by:  Resting the bowel. This involves not eating or drinking for a period of time.  Fluids that are given through an IV.  Medicine for pain and diarrhea.  Antibiotic medicines.  Cortisone medicines.  Surgery. Follow these instructions at home: Eating and drinking   Follow instructions from your health care provider about eating or drinking restrictions.  Drink enough fluid to keep your urine pale yellow.  Work with a dietitian to determine which foods cause your condition to flare up.  Avoid foods that cause flare-ups.  Eat a well-balanced diet. General instructions  If you were prescribed an antibiotic medicine, take it as told by your health care provider. Do not stop taking the antibiotic even if you start to feel better.  Take over-the-counter and prescription medicines only as told by your health care provider.  Keep all follow-up visits as told by your health care provider. This is important. Contact a health care provider if:  Your symptoms do not go away.  You develop new symptoms. Get help right away if you:  Have a fever that does not go away with treatment.  Develop chills.  Have extreme weakness, fainting, or dehydration.  Have repeated vomiting.  Develop severe pain in your abdomen.  Pass bloody or tarry stool. Summary  Colitis is inflammation of the colon. Colitis may last a short time (be acute), or it may last a long time (become chronic).  Treatment for this condition depends on the cause and may include resting the bowel, taking medicines, or having surgery.  If you were prescribed an antibiotic medicine, take it as told by your health care provider. Do not stop taking the antibiotic even if you start to feel better.  Get help right away if you develop severe pain in your abdomen.  Keep all  follow-up visits as told by your health care provider. This is important. This information is not intended to replace advice given to you by your health care provider. Make sure you discuss any questions you have with your health care provider. Document Released: 02/24/2004 Document Revised: 07/19/2017 Document Reviewed: 07/19/2017 Elsevier Interactive Patient Education  2019 [employer]   IMPORTANT INFORMATION: PAY CLOSE ATTENTION   PHYSICIAN DISCHARGE INSTRUCTIONS  Follow with Primary care provider  Mikey Kirschner, MD  and other consultants as instructed your Hospitalist Physician  SEEK MEDICAL CARE OR RETURN TO EMERGENCY ROOM IF SYMPTOMS COME BACK, WORSEN OR NEW PROBLEM DEVELOPS.   Please note: You were cared for by a hospitalist during your hospital stay. Every effort will be made to forward records to your primary care provider.  You can request that your primary care provider send for your hospital records if they have not received them.  Once you are discharged, your primary care physician will handle any further medical issues. Please note that NO REFILLS for any discharge medications will be authorized once you are discharged, as it is imperative that you return to your primary care physician (or establish a relationship with a primary care physician if you do not have one) for your post hospital discharge needs so that they can reassess your need for medications and monitor your lab values.  Please get a complete blood count and chemistry panel checked by your Primary MD at your next visit, and again as instructed by your Primary MD.  Get Medicines reviewed and adjusted: Please take all your medications with you for your next visit with your Primary MD  Laboratory/radiological data: Please request your Primary MD to go over all hospital tests and procedure/radiological results at the follow up, please ask your primary care provider to get all Hospital records sent to his/her  office.  In some cases, they will be blood work, cultures and biopsy results pending at the time of your discharge. Please request that your primary care provider follow up on these results.  If you are diabetic, please bring your blood sugar readings with you to your follow up appointment with primary care.    Please call and make your follow up appointments as soon as possible.    Also Note the following: If you experience worsening of your admission symptoms, develop shortness of breath, life threatening emergency, suicidal or homicidal thoughts you must seek medical attention immediately by calling 911 or calling your MD immediately  if symptoms less severe.  You must read complete instructions/literature along with all the possible adverse reactions/side effects for all the Medicines you take and that have been prescribed to you. Take any new Medicines after you have completely understood and accpet all the possible adverse reactions/side effects.   Do not drive when taking Pain medications or sleeping medications (Benzodiazepines)  Do not take more than prescribed  Pain, Sleep and Anxiety Medications. It is not advisable to combine anxiety,sleep and pain medications without talking with your primary care practitioner  Special Instructions: If you have smoked or chewed Tobacco  in the last 2 yrs please stop smoking, stop any regular Alcohol  and or any Recreational drug use.  Wear Seat belts while driving.

## 2018-05-22 NOTE — Telephone Encounter (Signed)
Mikey Kirschner, MD          Call pt, sched f u face to face visit by wed for a hospital follow up

## 2018-05-22 NOTE — Plan of Care (Signed)
  Problem: Education: Goal: Knowledge of General Education information will improve Description Including pain rating scale, medication(s)/side effects and non-pharmacologic comfort measures Outcome: Adequate for Discharge   Problem: Health Behavior/Discharge Planning: Goal: Ability to manage health-related needs will improve Outcome: Adequate for Discharge   Problem: Clinical Measurements: Goal: Ability to maintain clinical measurements within normal limits will improve Outcome: Adequate for Discharge   Problem: Activity: Goal: Risk for activity intolerance will decrease Outcome: Adequate for Discharge   Problem: Nutrition: Goal: Adequate nutrition will be maintained Outcome: Adequate for Discharge   Problem: Coping: Goal: Level of anxiety will decrease Outcome: Adequate for Discharge   Problem: Elimination: Goal: Will not experience complications related to bowel motility Outcome: Adequate for Discharge   Problem: Pain Managment: Goal: General experience of comfort will improve Outcome: Adequate for Discharge   Problem: Safety: Goal: Ability to remain free from injury will improve Outcome: Adequate for Discharge   Problem: Skin Integrity: Goal: Risk for impaired skin integrity will decrease Outcome: Adequate for Discharge

## 2018-05-22 NOTE — Progress Notes (Signed)
Patient discharged home today per MD orders. Patient vital signs WDL. IV removed and site WDL. Discharge Instructions including follow up appointments, medications, and education reviewed with patient. Patient verbalizes understanding.

## 2018-05-22 NOTE — Care Management Obs Status (Signed)
[facility] [facility] NOTIFICATION   Patient Details  Name: Miguel Morrow MRN: [id number] Date of Birth: July 16, 1950   Medicare Observation Status Notification Given:      Given To Val RN to give to patient, due to patient being on precautions.    Boneta Lucks, RN 05/22/2018, 1:31 PM

## 2018-05-22 NOTE — Discharge Summary (Addendum)
Physician Discharge Summary  Miguel Morrow UUV:[id number] DOB: 1950/12/30 DOA: 05/21/2018  PCP: Mikey Kirschner, MD GI: [place] Admit date: 05/21/2018 Discharge date: 05/22/2018  Admitted From: Home  Disposition: Home   Recommendations for Outpatient Follow-up:  1. Follow up with PCP in 1 weeks 2. Follow up with GI in 2 weeks 3. Please consider starting BP medication if patient agreeable  Discharge Condition: STABLE   CODE STATUS: FULL    Brief Hospitalization Summary: Please see all hospital notes, images, labs for full details of the hospitalization. Dr. Talmadge Coventry HPI: Miguel Morrow is a 68 y.o. male with medical history significant for hypertension, kidney stones to the ED with complaints of abdominal pain and loose stool of 1 week duration.  Abdominal pain is diffuse.  Patient reports multiple episodes of watery stools initially every 40 minutes now about every 2 hours, no blood.  Reports some nausea without vomiting. Patient denied fever or chills, no sore throat, no flulike symptoms, no difficulty breathing or cough, no sick contacts, and has been following lockdown restrictions.  It was in the ED 04/15/2018 with flank pain diagnosed with urinary tract infection, given IV Rocephin and IV fluids, discharged on a week course of QID Keflex.  Symptoms started about 10 days after.  Called his primary care provider, CT with contrast was obtained, which showed infectious or inflammatory pancolitis.  Patient was referred to ED.  ED Course:.  Elevated blood pressure systolic 474Q to 595G, temperature 97.9.  Marked leukocytosis at 22, Cr about baseline at 1.  Stool C. difficile obtained and pending.  UA shows moderate leukocytes.  Hospitalist called to admit for colitis.  Hospital Course:  The patient was started on IV ciprofloxacin and metronidazole to treat colitis and also given IV fluids and IV nausea and pain medications as needed for supportive therapy.  He responded very well to this plan.   He was given clear liquids and then his diet was advanced to soft diet which he tolerated well.  His diarrhea resolved.  His vitals have remained stable.  His white blood cell count has trended down 22,000 down to 13,000.  The patient is feeling much better.  He is stable to discharge home to have outpatient follow-up.  He is followed by Dr. Gala Romney for GI and will follow up with GI and his primary care provider.  He was given instructions to continue soft diet for the next 2 weeks.  He was given instructions to seek medical care or return to ER if symptoms come back, worsen or new problems develop.  The patient verbalized understanding.  The patient was noted to have elevated blood pressures while in the hospital even when his pain was well controlled.  I discussed with him that he likely would benefit from blood pressure medications.  He said that he would like to discuss with his primary care physician before starting blood pressure medications.  I asked for him to have his blood pressure rechecked and to discuss with primary care provider starting medications if his BP remains elevated.  He verbalized understanding.  Discharge Diagnoses:  Principal Problem:   Pancolitis ([place]) Active Problems:   Mixed hyperlipidemia   Bowel habit changes   Bradycardia   Essential hypertension   Discharge Instructions: Discharge Instructions    Call MD for:  difficulty breathing, headache or visual disturbances   Complete by:  As directed    Call MD for:  extreme fatigue   Complete by:  As directed  Call MD for:  persistant dizziness or light-headedness   Complete by:  As directed    Call MD for:  persistant nausea and vomiting   Complete by:  As directed    Call MD for:  severe uncontrolled pain   Complete by:  As directed    Increase activity slowly   Complete by:  As directed      Allergies as of 05/22/2018   No Known Allergies     Medication List    TAKE these medications   aspirin 81 MG  tablet Take 81 mg by mouth daily.   ciprofloxacin 500 MG tablet Commonly known as:  Cipro Take one tablet by mouth twice daily for 10 days What changed:    how much to take  how to take this  when to take this  additional instructions   ibuprofen 200 MG tablet Commonly known as:  ADVIL Take 200 mg by mouth 2 (two) times daily as needed for headache or moderate pain.   loratadine 10 MG tablet Commonly known as:  CLARITIN Take 10 mg by mouth daily as needed for allergies.   metroNIDAZOLE 500 MG tablet Commonly known as:  FLAGYL Take one tablet by mouth three times daily for 10 days What changed:    how much to take  how to take this  when to take this  additional instructions   ondansetron 4 MG disintegrating tablet Commonly known as:  Zofran ODT Dissolve one under toungue every 6 hours prn nausea. What changed:    how much to take  how to take this  when to take this  reasons to take this  additional instructions   psyllium 58.6 % packet Commonly known as:  METAMUCIL Take 1 packet by mouth daily as needed (for bloating).      Follow-up Information    Mikey Kirschner, MD. Schedule an appointment as soon as possible for a visit in 1 week(s).   Specialty:  Family Medicine Why:  Hospital follow Up  Contact information: [place] [address] [phone]        Daneil Dolin, MD. Schedule an appointment as soon as possible for a visit in 2 week(s).   Specialty:  Gastroenterology Why:  Hospital Follow Up  Contact information: [address] [place] [address] [phone]          No Known Allergies Allergies as of 05/22/2018   No Known Allergies     Medication List    TAKE these medications   aspirin 81 MG tablet Take 81 mg by mouth daily.   ciprofloxacin 500 MG tablet Commonly known as:  Cipro Take one tablet by mouth twice daily for 10 days What changed:    how much to take  how to take  this  when to take this  additional instructions   ibuprofen 200 MG tablet Commonly known as:  ADVIL Take 200 mg by mouth 2 (two) times daily as needed for headache or moderate pain.   loratadine 10 MG tablet Commonly known as:  CLARITIN Take 10 mg by mouth daily as needed for allergies.   metroNIDAZOLE 500 MG tablet Commonly known as:  FLAGYL Take one tablet by mouth three times daily for 10 days What changed:    how much to take  how to take this  when to take this  additional instructions   ondansetron 4 MG disintegrating tablet Commonly known as:  Zofran ODT Dissolve one under toungue every  6 hours prn nausea. What changed:    how much to take  how to take this  when to take this  reasons to take this  additional instructions   psyllium 58.6 % packet Commonly known as:  METAMUCIL Take 1 packet by mouth daily as needed (for bloating).       Procedures/Studies: Ct Abdomen Pelvis W Contrast  Result Date: 05/21/2018 CLINICAL DATA:  Intermittent right lower quadrant pain for 6 days. Question appendicitis. EXAM: CT ABDOMEN AND PELVIS WITH CONTRAST TECHNIQUE: Multidetector CT imaging of the abdomen and pelvis was performed using the standard protocol following bolus administration of intravenous contrast. CONTRAST:  100 mL OMNIPAQUE IOHEXOL 300 MG/ML COMPARISON:  CT abdomen and pelvis 04/15/2018. FINDINGS: Lower chest: No pleural or pericardial effusion. Heart size is normal. Lung bases are clear. Hepatobiliary: Scattered cysts in the liver are unchanged. Fatty infiltration of the liver is noted. No focal lesion. The gallbladder and biliary tree are unremarkable. Pancreas: Unremarkable. No pancreatic ductal dilatation or surrounding inflammatory changes. Spleen: Normal in size without focal abnormality. Adrenals/Urinary Tract: 2 small right renal cysts are seen. The kidneys are otherwise unremarkable. Small stone in the left kidney described on report of the prior  exam is not visible on this study. Urinary bladder appears normal. The adrenal glands are unremarkable. Stomach/Bowel: There is thickening of the walls of the colon throughout. Stranding about the colon is most notable along the ascending colon. No pneumatosis, portal venous gas or free intraperitoneal air is identified. No focal fluid collection. The stomach, small bowel and appendix are unremarkable. Vascular/Lymphatic: Aortic atherosclerosis. No enlarged abdominal or pelvic lymph nodes. Reproductive: Prostate is unremarkable. Other: None. Musculoskeletal: No acute or focal bony abnormality. IMPRESSION: The examination is positive for infectious or inflammatory pancolitis. Fatty infiltration of the liver and multiple hepatic cysts. Atherosclerosis. Electronically Signed   By: Inge Rise M.D.   On: 05/21/2018 15:10      Subjective: The patient's abdominal pain is much better and he is eating and drinking well.  He has been ambulating in the room with no difficulty.    Discharge Exam: Vitals:   05/22/18 0547 05/22/18 1321  BP: (!) 142/87 134/69  Pulse: (!) 55 (!) 55  Resp:  17  Temp: (!) 97.4 F (36.3 C) 98.2 F (36.8 C)  SpO2: 97% 99%   Vitals:   05/21/18 2121 05/21/18 2211 05/22/18 0547 05/22/18 1321  BP: 133/74 (!) 164/80 (!) 142/87 134/69  Pulse:  (!) 59 (!) 55 (!) 55  Resp:    17  Temp:  97.7 F (36.5 C) (!) 97.4 F (36.3 C) 98.2 F (36.8 C)  TempSrc:  Oral Oral   SpO2: 98% 97% 97% 99%  Weight:      Height:       General: Pt is alert, awake, not in acute distress Cardiovascular: RRR, S1/S2 +, no rubs, no gallops Respiratory: CTA bilaterally, no wheezing, no rhonchi Abdominal: Soft, NT, ND, bowel sounds + Extremities: no edema, no cyanosis   The results of significant diagnostics from this hospitalization (including imaging, microbiology, ancillary and laboratory) are listed below for reference.     Microbiology: Recent Results (from the past 240 hour(s))  C  difficile quick scan w PCR reflex     Status: None   Collection Time: 05/21/18  4:04 PM  Result Value Ref Range Status   C Diff antigen NEGATIVE NEGATIVE Final   C Diff toxin NEGATIVE NEGATIVE Final   C Diff interpretation No C. difficile  detected.  Final    Comment: Performed at [facility], [address] [place], [place] [address]     Labs: BNP (last 3 results) No results for input(s): BNP in the last 8760 hours. Basic Metabolic Panel: Recent Labs  Lab 05/21/18 1042 05/21/18 1924 05/22/18 0434  NA 138  --  138  K 3.5  --  3.3*  CL 107  --  109  CO2 24  --  22  GLUCOSE 106*  --  88  BUN 14  --  7*  CREATININE 1.00  --  0.77  CALCIUM 8.2*  --  7.6*  MG  --  2.1  --    Liver Function Tests: Recent Labs  Lab 05/21/18 1042  AST 19  ALT 21  ALKPHOS 38  BILITOT 0.5  PROT 6.0*  ALBUMIN 3.2*   No results for input(s): LIPASE, AMYLASE in the last 168 hours. No results for input(s): AMMONIA in the last 168 hours. CBC: Recent Labs  Lab 05/21/18 1042 05/22/18 0434  WBC 22.2* 13.5*  NEUTROABS 17.4*  --   HGB 15.4 12.9*  HCT 48.7 40.1  MCV 91.9 91.6  PLT 342 271   Cardiac Enzymes: No results for input(s): CKTOTAL, CKMB, CKMBINDEX, TROPONINI in the last 168 hours. BNP: Invalid input(s): POCBNP CBG: No results for input(s): GLUCAP in the last 168 hours. D-Dimer No results for input(s): DDIMER in the last 72 hours. Hgb A1c No results for input(s): HGBA1C in the last 72 hours. Lipid Profile No results for input(s): CHOL, HDL, LDLCALC, TRIG, CHOLHDL, LDLDIRECT in the last 72 hours. Thyroid function studies No results for input(s): TSH, T4TOTAL, T3FREE, THYROIDAB in the last 72 hours.  Invalid input(s): FREET3 Anemia work up No results for input(s): VITAMINB12, FOLATE, FERRITIN, TIBC, IRON, RETICCTPCT in the last 72 hours. Urinalysis    Component Value Date/Time   COLORURINE AMBER (A) 04/15/2018 1937   APPEARANCEUR HAZY (A) 04/15/2018 1937   LABSPEC 1.023  04/15/2018 1937   PHURINE 5.0 04/15/2018 1937   GLUCOSEU NEGATIVE 04/15/2018 1937   HGBUR MODERATE (A) 04/15/2018 1937   BILIRUBINUR + 05/21/2018 1031   KETONESUR NEGATIVE 04/15/2018 1937   PROTEINUR Positive (A) 05/21/2018 1031   PROTEINUR 100 (A) 04/15/2018 1937   NITRITE + 05/21/2018 1031   NITRITE POSITIVE (A) 04/15/2018 1937   LEUKOCYTESUR Moderate (2+) (A) 05/21/2018 1031   LEUKOCYTESUR NEGATIVE 04/15/2018 1937   Sepsis Labs Invalid input(s): PROCALCITONIN,  WBC,  LACTICIDVEN Microbiology Recent Results (from the past 240 hour(s))  C difficile quick scan w PCR reflex     Status: None   Collection Time: 05/21/18  4:04 PM  Result Value Ref Range Status   C Diff antigen NEGATIVE NEGATIVE Final   C Diff toxin NEGATIVE NEGATIVE Final   C Diff interpretation No C. difficile detected.  Final    Comment: Performed at [facility], [address], [place], [place] [address]    Time coordinating discharge:   SIGNED:  Irwin Brakeman, MD  [facility] 05/22/2018, 1:56 PM How to contact the [facility] Attending or Consulting provider [place] or covering provider during after hours [place], for this patient?  1. Check the care team in [facility] and look for a) attending/consulting [facility] provider listed and b) the [facility] team listed 2. Log into [email or web address] and use [place]'s universal password to access. If you do not have the password, please contact the hospital operator. 3. Locate the [facility] provider you are looking for under [facility] and page to a number that you can be directly reached. 4. If you still have difficulty reaching the provider, please page the [facility] (Director on Call) for the Hospitalists listed on amion for assistance.

## 2018-05-23 LAB — HIV ANTIBODY (ROUTINE TESTING W REFLEX): HIV Screen 4th Generation wRfx: NONREACTIVE

## 2018-05-28 ENCOUNTER — Encounter: Payer: Self-pay | Admitting: Family Medicine

## 2018-05-28 ENCOUNTER — Other Ambulatory Visit: Payer: Self-pay

## 2018-05-28 ENCOUNTER — Ambulatory Visit (INDEPENDENT_AMBULATORY_CARE_PROVIDER_SITE_OTHER): Payer: Medicare HMO | Admitting: Family Medicine

## 2018-05-28 VITALS — Temp 97.9°F

## 2018-05-28 DIAGNOSIS — K5100 Ulcerative (chronic) pancolitis without complications: Secondary | ICD-10-CM | POA: Diagnosis not present

## 2018-05-28 NOTE — Progress Notes (Signed)
   Subjective:    Patient ID: Miguel Morrow, male    DOB: April 12, 1950, 68 y.o.   MRN: [id number]  HPIHospitalization follow up on pancolitis. Pt states he is feeling much better. Did have some abdominal pain this morning and some tightness in abdomen. Taking cipro and flagyl. Requesting referral to Dr. Sydell Axon.    Complete hospital record reviewed.  Patient states overall feeling quite a bit better.  Patient was contacted within 48 hours of discharge from the hospital.  Reports no fever no chills.  Handling medication well.  Abdominal discomfort better.  Reports blood pressures were elevated.  Hospital doctor raise question of initiating blood pressure meds. abd discomfort verall berter    No fever no chills  Handling po meds well   Review of Systems No headache, no major weight loss or weight gain, no chest pain no back pain abdominal pain no change in bowel habits complete ROS otherwise negative     Objective:   Physical Exam Alert and oriented, vitals reviewed and stable, NAD ENT-TM's and ext canals WNL bilat via otoscopic exam Soft palate, tonsils and post pharynx WNL via oropharyngeal exam Neck-symmetric, no masses; thyroid nonpalpable and nontender Pulmonary-no tachypnea or accessory muscle use; Clear without wheezes via auscultation Card--no abnrml murmurs, rhythm reg and rate WNL Carotid pulses symmetric, without bruits Blood pressure good on repeat  Abdomen excellent bowel sounds no discrete tenderness       Assessment & Plan:  Impression status post hospitalization for pancolitis.  We had already started Cipro and Flagyl the day prior to hospitalization.  This was maintained.  Patient notes overall feeling quite a bit better  2.  Elevated blood pressure.  Blood pressure now good numbers excellent will maintain no additional meds.  Rationale discussed

## 2018-05-29 ENCOUNTER — Encounter: Payer: Self-pay | Admitting: Family Medicine

## 2018-06-04 ENCOUNTER — Encounter: Payer: Self-pay | Admitting: Gastroenterology

## 2018-06-04 ENCOUNTER — Ambulatory Visit (INDEPENDENT_AMBULATORY_CARE_PROVIDER_SITE_OTHER): Payer: Medicare HMO | Admitting: Gastroenterology

## 2018-06-04 ENCOUNTER — Other Ambulatory Visit: Payer: Self-pay

## 2018-06-04 DIAGNOSIS — K5100 Ulcerative (chronic) pancolitis without complications: Secondary | ICD-10-CM | POA: Diagnosis not present

## 2018-06-04 NOTE — Patient Instructions (Signed)
1. Monitor for any recurrent or persisting abdominal pain, diarrhea.  Let me know if you continue to have problems. 2. We will plan to see you back in the office in 4 months for follow-up.

## 2018-06-04 NOTE — Progress Notes (Signed)
Primary Care Physician:  Mikey Kirschner, MD Primary GI:  Garfield Cornea, MD   Patient Location: Home  Provider Location: [facility] office  Reason for Visit: pancolitis  Persons present on the virtual encounter, with roles: Patient, myself (provider),Mindy Quincy Simmonds CMA (updated meds and allergies)  Total time (minutes) spent on medical discussion: 15 minutes  Due to COVID-19, visit was conducted using [email or web address] method.  Visit was requested by patient.  Virtual Visit via [email or web address]  I connected with Miguel Morrow on 06/04/18 at  8:30 AM EDT by [email or web address] and verified that I am speaking with the correct person using two identifiers.   I discussed the limitations, risks, security and privacy concerns of performing an evaluation and management service by telephone/video and the availability of in person appointments. I also discussed with the patient that there may be a patient responsible charge related to this service. The patient expressed understanding and agreed to proceed.   HPI:   Miguel Morrow is a 68 y.o. male who presents for virtual visit regarding pancolitis at the request of Dr. Wolfgang Phoenix.  We saw the patient back in February for surveillance colonoscopy with history of colonic adenoma.  He was found to have Six 4 to 6 mm polyps in the descending colon, in the ascending colon and in the cecum, tubular adenomas.  Diverticulosis.  Next colonoscopy in 3 years.  Patient was admitted to the hospital April 21 with abdominal pain and diarrhea for 1 week duration.  Stool was nonbloody.  Patient was kept overnight.  He was treated with Cipro and Flagyl.  His C. difficile quick scan was negative.  GI pathogen panel was negative as well.  CT with abdomen pelvis with contrast showed fatty liver, liver cyst, findings for infectious or inflammatory pancolitis.  His white blood cell count was 21,800 a month ago, 22,200 on admission but dropped down to 13,500.  It is notable he was also seen in the ED in  March with flank pain diagnosed with UTI and given a course of Keflex.  Urine culture was positive for E. coli.  Patient completed Cipro and Flagyl over the weekend.  He states his been greater than 7 days since he had diarrhea.  He is now having about 2-3 soft stools daily.  His baseline is 2-3 formed stools daily.  Overall he is feeling much better.  His appetite has improved.  He did lose about 12 pounds with his illness.  The only thing he has noticed since his hospitalization was a couple of times he had some short-lived sharp pain on the right-hand side of his abdomen noted with bending over.  He tends to sleep on his right side.  This morning when he got up he was a little sore but that is resolved now.  He denies any melena or rectal bleeding.  Paternal aunt, colon cancer.      Current Outpatient Medications  Medication Sig Dispense Refill   aspirin 81 MG tablet Take 81 mg by mouth daily.     ibuprofen (ADVIL,MOTRIN) 200 MG tablet Take 200 mg by mouth 2 (two) times daily as needed for headache or moderate pain.     loratadine (CLARITIN) 10 MG tablet Take 10 mg by mouth daily as needed for allergies.     ondansetron (ZOFRAN ODT) 4 MG disintegrating tablet Dissolve one under toungue every 6 hours prn nausea. (Patient taking differently: Take 4 mg by mouth every 6 (six) hours as needed for nausea  or vomiting. ) 20 tablet 0   psyllium (METAMUCIL) 58.6 % packet Take 1 packet by mouth daily as needed (for bloating).     No current facility-administered medications for this visit.     Past Medical History:  Diagnosis Date   Bradycardia    Nephrolithiasis 1984    Past Surgical History:  Procedure Laterality Date   COLONOSCOPY N/A 09/26/2012   Procedure: COLONOSCOPY;  Surgeon: Daneil Dolin, MD;  Location: [facility];  Service: Endoscopy;  Laterality: N/A;  1:45   COLONOSCOPY N/A 03/13/2018   Dr. Gala Romney: 6 polyps removed, tubular adenomas.  Diverticulosis.  Next colonoscopy 3  years.   Kidney stone basket extraction  1984   POLYPECTOMY  03/13/2018   Procedure: POLYPECTOMY;  Surgeon: Daneil Dolin, MD;  Location: [facility];  Service: Endoscopy;;  colon    Family History  Problem Relation Age of Onset   Heart disease Father        Died age 7   Colon cancer Paternal Aunt        Greater than age of 26   Heart disease Paternal Uncle    Liver disease Neg Hx     Social History   Socioeconomic History   Marital status: Married    Spouse name: Not on file   Number of children: 2   Years of education: Not on file   Highest education level: Not on file  Occupational History   Occupation: Social research officer, government strain: Not on file   Food insecurity:    Worry: Not on file    Inability: Not on file   Transportation needs:    Medical: Not on file    Non-medical: Not on file  Tobacco Use   Smoking status: Former Smoker    Types: Cigarettes    Last attempt to quit: 01/31/2008    Years since quitting: 10.3   Smokeless tobacco: Former Systems developer    Quit date: 05/17/2008  Substance and Sexual Activity   Alcohol use: No    Alcohol/week: 0.0 standard drinks   Drug use: No   Sexual activity: Yes  Lifestyle   Physical activity:    Days per week: Not on file    Minutes per session: Not on file   Stress: Not on file  Relationships   Social connections:    Talks on phone: Not on file    Gets together: Not on file    Attends religious service: Not on file    Active member of club or organization: Not on file    Attends meetings of clubs or organizations: Not on file    Relationship status: Not on file   Intimate partner violence:    Fear of current or ex partner: Not on file    Emotionally abused: Not on file    Physically abused: Not on file    Forced sexual activity: Not on file  Other Topics Concern   Not on file  Social History Narrative   Not on file      ROS:  General: Negative for anorexia,  weight loss, fever, chills, fatigue, weakness. Eyes: Negative for vision changes.  ENT: Negative for hoarseness, difficulty swallowing , nasal congestion. CV: Negative for chest pain, angina, palpitations, dyspnea on exertion, peripheral edema.  Respiratory: Negative for dyspnea at rest, dyspnea on exertion, cough, sputum, wheezing.  GI: See history of present illness. GU:  Negative for dysuria, hematuria, urinary incontinence, urinary frequency, nocturnal  urination.  MS: Negative for joint pain, low back pain.  Derm: Negative for rash or itching.  Neuro: Negative for weakness, abnormal sensation, seizure, frequent headaches, memory loss, confusion.  Psych: Negative for anxiety, depression, suicidal ideation, hallucinations.  Endo: Negative for unusual weight change.  Heme: Negative for bruising or bleeding. Allergy: Negative for rash or hives.   Observations/Objective: Pleasant well-nourished well-developed Caucasian gentleman in no acute distress.  Otherwise exam not available.  Lab Results  Component Value Date   CREATININE 0.77 05/22/2018   BUN 7 (L) 05/22/2018   NA 138 05/22/2018   K 3.3 (L) 05/22/2018   CL 109 05/22/2018   CO2 22 05/22/2018   Lab Results  Component Value Date   ALT 21 05/21/2018   AST 19 05/21/2018   ALKPHOS 38 05/21/2018   BILITOT 0.5 05/21/2018   Lab Results  Component Value Date   WBC 13.5 (H) 05/22/2018   HGB 12.9 (L) 05/22/2018   HCT 40.1 05/22/2018   MCV 91.6 05/22/2018   PLT 271 05/22/2018     Assessment and Plan: Pleasant 68 year old gentleman with recent admission for pancolitis.  He had a surveillance colonoscopy in February 2020, 6 polyps removed.  He had diverticulosis but otherwise unremarkable exam.  In March he went to the emergency department with left flank pain, dysuria, fever.  Was concern for pyelonephritis versus kidney stone.  Urine culture was positive for E. coli.  Patient was treated with Keflex.  On April 21 2 presented  back to emergency department with 1 week history of abdominal pain, diarrhea, nausea.  CT concerning for pancolitis.  He was treated with Cipro and Flagyl.  Stool studies were negative.  Leukocytosis improved.  At this point his bowel function has almost returned to normal.  He has had a couple episodes of vague right-sided abdominal discomfort, short-lived.  Unlikely appendicitis.  Possibly musculoskeletal versus resolving colitis.  Given recent colonoscopy, reassured patient regarding his concerns about possible colon cancer.  I suspect he had infectious etiology to explain his pancolitis rather than IBD.  He is much improved, therefore would recommend following patient clinically.  If he has any recurrent symptoms or worsening more persisting right-sided abdominal discomfort, he should let us know.  We will plan to get him back in the office in 4 months but he can call sooner if needed.  If he is 100% improved, he can consider canceling that appointment.  Follow Up Instructions:    I discussed the assessment and treatment plan with the patient. The patient was provided an opportunity to ask questions and all were answered. The patient agreed with the plan and demonstrated an understanding of the instructions. AVS mailed to patient's home address.   The patient was advised to call back or seek an in-person evaluation if the symptoms worsen or if the condition fails to improve as anticipated.  I provided 15 minutes of virtual face-to-face time during this encounter.   Neil Crouch, PA-C

## 2018-06-05 ENCOUNTER — Telehealth: Payer: Self-pay | Admitting: Gastroenterology

## 2018-06-05 NOTE — Telephone Encounter (Signed)
Lmom, waiting on a return call.  

## 2018-06-05 NOTE — Telephone Encounter (Signed)
Pt returned call and is aware of RMR & LSL recommendations.

## 2018-06-05 NOTE — Telephone Encounter (Signed)
Please let patient know that I spoke with Dr. Gala Romney about recent illness. He agrees with monitoring for recurrent symptoms for now. We will plan to see him in office in four months for follow up. Unless he develops recurrent/persistent abdominal pain/diarrhea we would NOT advice further work up ie TCS or repeat CT right now.

## 2018-10-14 ENCOUNTER — Encounter: Payer: Self-pay | Admitting: Internal Medicine

## 2018-10-16 ENCOUNTER — Telehealth: Payer: Self-pay | Admitting: Family Medicine

## 2018-10-16 DIAGNOSIS — E782 Mixed hyperlipidemia: Secondary | ICD-10-CM

## 2018-10-16 DIAGNOSIS — Z125 Encounter for screening for malignant neoplasm of prostate: Secondary | ICD-10-CM

## 2018-10-16 DIAGNOSIS — I10 Essential (primary) hypertension: Secondary | ICD-10-CM

## 2018-10-16 DIAGNOSIS — Z79899 Other long term (current) drug therapy: Secondary | ICD-10-CM

## 2018-10-16 NOTE — Telephone Encounter (Signed)
Lip liv m7 psa

## 2018-10-16 NOTE — Telephone Encounter (Signed)
Patient has physical on 10/30 and needing labs done [phone]

## 2018-10-16 NOTE — Telephone Encounter (Signed)
Last labs 4/20- hospital labs , Liver, CBC, Met 7 , Mag, HIV and gastric panel

## 2018-10-17 NOTE — Telephone Encounter (Signed)
Blood work ordered in [employer]. Patient notified

## 2018-11-05 ENCOUNTER — Encounter: Payer: Self-pay | Admitting: Internal Medicine

## 2018-11-05 ENCOUNTER — Other Ambulatory Visit: Payer: Self-pay

## 2018-11-05 ENCOUNTER — Ambulatory Visit: Payer: Medicare HMO | Admitting: Internal Medicine

## 2018-11-05 VITALS — BP 179/80 | HR 56 | Temp 96.9°F | Ht 66.0 in | Wt 175.8 lb

## 2018-11-05 DIAGNOSIS — K5100 Ulcerative (chronic) pancolitis without complications: Secondary | ICD-10-CM | POA: Diagnosis not present

## 2018-11-05 NOTE — Progress Notes (Signed)
Primary Care Physician:  Mikey Kirschner, MD Primary Gastroenterologist:  Dr. Gala Romney  Pre-Procedure History & Physical: HPI:  Miguel Morrow is a 68 y.o. male here for follow-up of hospitalization related to diarrhea and pancolitis several months ago now.  He is doing well bowel function actually is back to normal.  His only GI symptoms are that of abdominal bloating.  He improved temporally with a course of Cipro and Flagyl.  C. difficile and GIP came back negative.  It is notable this patient's illness started just a couple of weeks after taking antibiotics for urinary tract infection. Denies abdominal pain.  Colonoscopy back in February of this year revealed multiple colonic adenomas-removed; due for surveillance 2023.  He denies any temporal association with intake of dairy products carbonated caffeinated or diet beverages.  Only, has not had any bleeding.  Appetite is described as "great".  Blood pressure up today.  Took Sudafed for cold up until about a week ago then stopped.  Dorsals quite a bit of stress lately.  Past Medical History:  Diagnosis Date  . Bradycardia   . Nephrolithiasis 1984    Past Surgical History:  Procedure Laterality Date  . COLONOSCOPY N/A 09/26/2012   Procedure: COLONOSCOPY;  Surgeon: Daneil Dolin, MD;  Location: [facility];  Service: Endoscopy;  Laterality: N/A;  1:45  . COLONOSCOPY N/A 03/13/2018   Dr. Gala Romney: 6 polyps removed, tubular adenomas.  Diverticulosis.  Next colonoscopy 3 years.  . Kidney stone basket extraction  1984  . POLYPECTOMY  03/13/2018   Procedure: POLYPECTOMY;  Surgeon: Daneil Dolin, MD;  Location: [facility];  Service: Endoscopy;;  colon    Prior to Admission medications   Medication Sig Start Date End Date Taking? Authorizing Provider  aspirin 81 MG tablet Take 81 mg by mouth daily.   Yes [provider]  ibuprofen (ADVIL,MOTRIN) 200 MG tablet Take 200 mg by mouth 2 (two) times daily as needed for headache or  moderate pain.   Yes [provider]  loratadine (CLARITIN) 10 MG tablet Take 10 mg by mouth daily as needed for allergies.   Yes [provider]  psyllium (METAMUCIL) 58.6 % packet Take 1 packet by mouth daily as needed (for bloating).   Yes [provider]  ondansetron (ZOFRAN ODT) 4 MG disintegrating tablet Dissolve one under toungue every 6 hours prn nausea. Patient not taking: Reported on 11/05/2018 05/20/18   Mikey Kirschner, MD    Allergies as of 11/05/2018  . (No Known Allergies)    Family History  Problem Relation Age of Onset  . Heart disease Father        Died age 50  . Colon cancer Paternal Aunt        Greater than age of 68  . Heart disease Paternal Uncle   . Liver disease Neg Hx     Social History   Socioeconomic History  . Marital status: Married    Spouse name: Not on file  . Number of children: 2  . Years of education: Not on file  . Highest education level: Not on file  Occupational History  . Occupation: Arts development officer  . Financial resource strain: Not on file  . Food insecurity    Worry: Not on file    Inability: Not on file  . Transportation needs    Medical: Not on file    Non-medical: Not on file  Tobacco Use  . Smoking status:  Former Smoker    Types: Cigarettes    Quit date: 01/31/2008    Years since quitting: 10.7  . Smokeless tobacco: Former Systems developer    Quit date: 05/17/2008  Substance and Sexual Activity  . Alcohol use: No    Alcohol/week: 0.0 standard drinks  . Drug use: No  . Sexual activity: Yes  Lifestyle  . Physical activity    Days per week: Not on file    Minutes per session: Not on file  . Stress: Not on file  Relationships  . Social Herbalist on phone: Not on file    Gets together: Not on file    Attends religious service: Not on file    Active member of club or organization: Not on file    Attends meetings of clubs or organizations: Not on file    Relationship status: Not on  file  . Intimate partner violence    Fear of current or ex partner: Not on file    Emotionally abused: Not on file    Physically abused: Not on file    Forced sexual activity: Not on file  Other Topics Concern  . Not on file  Social History Narrative  . Not on file    Review of Systems: See HPI, otherwise negative ROS  Physical Exam: BP (!) 179/80   Pulse (!) 56   Temp (!) 96.9 F (36.1 C) (Temporal)   Ht 5' 6"  (1.676 m)   Wt 175 lb 12.8 oz (79.7 kg)   BMI 28.37 kg/m  General:   Alert,  Well-developed, well-nourished, pleasant and cooperative in NAD Neck:  Supple; no masses or thyromegaly. No significant cervical adenopathy. Lungs:  Clear throughout to auscultation.   No wheezes, crackles, or rhonchi. No acute distress. Heart:  Regular rate and rhythm; no murmurs, clicks, rubs,  or gallops. Abdomen: Non-distended, normal bowel sounds.  Soft and nontender without appreciable mass or hepatosplenomegaly.  Pulses:  Normal pulses noted. Extremities:  Without clubbing or edema.  Impression/Plan: Very pleasant 68 year old gentleman with a history of diarrheal illness associated with pancolitis for which was hospitalized early in year.  He is essentially fully recovered.  He has some persisting abdominal bloating but otherwise doing extremely well.  Colonoscopy is up-to-date from February of this year he is due for surveillance colonoscopy in 2023.  This point I do not feel there is any need for further testing.  I suspect he had an infectious colitis.  His history of antibiotics just preceding the onset of his illness is interesting but C. difficile toxin assay came back negative. Blood pressure elevated today.   Recommendations  BP recheck by Dr. Wolfgang Phoenix later this week  Probiotic in the way of Align or Digestive Advantage  Information on Gas/ bloat  Repeat colonoscopy in 3 years  OV in 4 months      Notice: This dictation was prepared with Dragon dictation along with  smaller phrase technology. Any transcriptional errors that result from this process are unintentional and may not be corrected upon review.

## 2018-11-05 NOTE — Patient Instructions (Signed)
BP recheck by Dr. Wolfgang Phoenix later this week  Probiotic in the way of Align or Digestive Advantage  Information on Gas/ bloat  Repeat colonoscopy in 3 years  OV in 4 months

## 2018-11-13 DIAGNOSIS — I10 Essential (primary) hypertension: Secondary | ICD-10-CM | POA: Diagnosis not present

## 2018-11-13 DIAGNOSIS — Z79899 Other long term (current) drug therapy: Secondary | ICD-10-CM | POA: Diagnosis not present

## 2018-11-13 DIAGNOSIS — E782 Mixed hyperlipidemia: Secondary | ICD-10-CM | POA: Diagnosis not present

## 2018-11-13 DIAGNOSIS — Z125 Encounter for screening for malignant neoplasm of prostate: Secondary | ICD-10-CM | POA: Diagnosis not present

## 2018-11-14 LAB — BASIC METABOLIC PANEL
BUN/Creatinine Ratio: 19 (ref 10–24)
BUN: 16 mg/dL (ref 8–27)
CO2: 25 mmol/L (ref 20–29)
Calcium: 9.6 mg/dL (ref 8.6–10.2)
Chloride: 103 mmol/L (ref 96–106)
Creatinine, Ser: 0.85 mg/dL (ref 0.76–1.27)
GFR calc Af Amer: 103 mL/min/{1.73_m2} (ref >59)
GFR calc non Af Amer: 90 mL/min/{1.73_m2} (ref >59)
Glucose: 82 mg/dL (ref 65–99)
Potassium: 4.7 mmol/L (ref 3.5–5.2)
Sodium: 142 mmol/L (ref 134–144)

## 2018-11-14 LAB — LIPID PANEL
Chol/HDL Ratio: 3.6 ratio (ref 0.0–5.0)
Cholesterol, Total: 209 mg/dL — ABNORMAL HIGH (ref 100–199)
HDL: 58 mg/dL (ref >39)
LDL Chol Calc (NIH): 128 mg/dL — ABNORMAL HIGH (ref 0–99)
Triglycerides: 127 mg/dL (ref 0–149)
VLDL Cholesterol Cal: 23 mg/dL (ref 5–40)

## 2018-11-14 LAB — HEPATIC FUNCTION PANEL
ALT: 23 IU/L (ref 0–44)
AST: 21 IU/L (ref 0–40)
Albumin: 4.4 g/dL (ref 3.8–4.8)
Alkaline Phosphatase: 44 IU/L (ref 39–117)
Bilirubin Total: 0.3 mg/dL (ref 0.0–1.2)
Bilirubin, Direct: 0.1 mg/dL (ref 0.00–0.40)
Total Protein: 6.8 g/dL (ref 6.0–8.5)

## 2018-11-14 LAB — PSA: Prostate Specific Ag, Serum: 3 ng/mL (ref 0.0–4.0)

## 2018-11-29 ENCOUNTER — Encounter: Payer: Medicare HMO | Admitting: Family Medicine

## 2018-12-05 ENCOUNTER — Encounter: Payer: Medicare HMO | Admitting: Family Medicine

## 2018-12-10 ENCOUNTER — Other Ambulatory Visit: Payer: Self-pay

## 2018-12-10 ENCOUNTER — Ambulatory Visit (INDEPENDENT_AMBULATORY_CARE_PROVIDER_SITE_OTHER): Payer: Medicare HMO | Admitting: Family Medicine

## 2018-12-10 DIAGNOSIS — B029 Zoster without complications: Secondary | ICD-10-CM | POA: Diagnosis not present

## 2018-12-10 MED ORDER — VALACYCLOVIR HCL 1 G PO TABS: 1000.0000 mg | ORAL_TABLET | Freq: Three times a day (TID) | ORAL | 0 refills | Status: DC

## 2018-12-10 NOTE — Progress Notes (Signed)
   Subjective:    Patient ID: Miguel Morrow, male    DOB: 30-Apr-1950, 68 y.o.   MRN: [id number]  HPI Just started toay  Just came up   soe tingling  No pain   No hx shingles shot   Had chicken poox   Patient woke up with rash on forehead today. Patient wonders if it is shingles. Review of Systems     Objective:   Physical Exam Alert vitals stable, NAD. Blood pressure good on repeat. HEENT normal. Lungs clear. Heart regular rate and rhythm. Fairly classic distribution of shingles across right forehead right eyebrow not involving the lateral nose or tip of nose.  Ocular exam normal       Assessment & Plan:  Impression shingles.  Symptom care discussed.  Natural history discussed.  Valtrex prescribed local measures discussed

## 2018-12-11 ENCOUNTER — Encounter: Payer: Medicare HMO | Admitting: Family Medicine

## 2018-12-13 ENCOUNTER — Other Ambulatory Visit: Payer: Self-pay

## 2018-12-13 ENCOUNTER — Ambulatory Visit (INDEPENDENT_AMBULATORY_CARE_PROVIDER_SITE_OTHER): Payer: Medicare HMO | Admitting: Family Medicine

## 2018-12-13 DIAGNOSIS — B029 Zoster without complications: Secondary | ICD-10-CM

## 2018-12-13 NOTE — Progress Notes (Signed)
   Subjective:  Audio only  Patient ID: Miguel Morrow, male    DOB: 1950-06-18, 68 y.o.   MRN: [id number]  HPIpt's son Miguel Morrow states he thinks he is having an allergic reaction to valtrex he was prescribed for shingles. Started having facial swelling last night. No trouble swallowing or breathing. New rash popped up on back and chest. Left eye was swollen shut this morning but is not opened back up. Eye is still puffy.  Stopped valtrex last night.   Virtual Visit via Video Note  I connected with Miguel Morrow on 12/13/18 at 11:00 AM EST by a video enabled telemedicine application and verified that I am speaking with the correct person using two identifiers.  Location: Patient: home Provider: office   I discussed the limitations of evaluation and management by telemedicine and the availability of in person appointments. The patient expressed understanding and agreed to proceed.  History of Present Illness:    Observations/Objective:   Assessment and Plan:   Follow Up Instructions:    I discussed the assessment and treatment plan with the patient. The patient was provided an opportunity to ask questions and all were answered. The patient agreed with the plan and demonstrated an understanding of the instructions.   The patient was advised to call back or seek an in-person evaluation if the symptoms worsen or if the condition fails to improve as anticipated.  I provided 18 minutes of non-face-to-face time during this encounter.   Patient has confirmed shingles.  Fairly classic appearance from seen over this week.  Now more swelling with it.  No pain.  No involvement lateral side of nose or tip of nose no actual ocular symptoms.  Some periorbital swelling and superior portion near the rash  Is developed a very slight truncal rash feels may be due to Valtrex.  Not particularly itchy not really a problem     Review of Systems No headache no chest pain    Objective:   Physical Exam   Virtual     Assessment & Plan:  Impression shingles with some periorbital edema superiorly near the rash not unexpected discussed with patient  2.  Possible Valtrex rash.  Discussion held.  There is really no good option.  Alternatively speaking.  Patient would like to maintain medicine which I think is reasonable warning signs discussed as far as worsening rash take full course if he can handle it without worsening of sensitivity rash

## 2018-12-15 ENCOUNTER — Encounter: Payer: Self-pay | Admitting: Family Medicine

## 2018-12-24 ENCOUNTER — Other Ambulatory Visit: Payer: Self-pay

## 2018-12-24 ENCOUNTER — Ambulatory Visit (INDEPENDENT_AMBULATORY_CARE_PROVIDER_SITE_OTHER): Payer: Medicare HMO | Admitting: Family Medicine

## 2018-12-24 VITALS — BP 138/86 | Temp 98.0°F | Ht 66.5 in | Wt 172.0 lb

## 2018-12-24 DIAGNOSIS — Z23 Encounter for immunization: Secondary | ICD-10-CM | POA: Diagnosis not present

## 2018-12-24 DIAGNOSIS — Z0000 Encounter for general adult medical examination without abnormal findings: Secondary | ICD-10-CM

## 2018-12-24 NOTE — Progress Notes (Signed)
Subjective:    Patient ID: Miguel Morrow, male    DOB: 01-03-1951, 68 y.o.   MRN: [id number]  HPI The patient comes in today for a wellness visit.    A review of their health history was completed.  A review of medications was also completed.  Any needed refills; none  Eating habits: health conscious  Falls/  MVA accidents in past few months: none  Regular exercise: none  Specialist pt sees on regular basis: Dr. Sydell Axon  Preventative health issues were discussed.   Additional concerns: pain on right side of head where his shingles are  Results for orders placed or performed in visit on 10/16/18  Lipid panel  Result Value Ref Range   Cholesterol, Total 209 (H) 100 - 199 mg/dL   Triglycerides 127 0 - 149 mg/dL   HDL 58 >39 mg/dL   VLDL Cholesterol Cal 23 5 - 40 mg/dL   LDL Chol Calc (NIH) 128 (H) 0 - 99 mg/dL   Chol/HDL Ratio 3.6 0.0 - 5.0 ratio  Hepatic function panel  Result Value Ref Range   Total Protein 6.8 6.0 - 8.5 g/dL   Albumin 4.4 3.8 - 4.8 g/dL   Bilirubin Total 0.3 0.0 - 1.2 mg/dL   Bilirubin, Direct 0.10 0.00 - 0.40 mg/dL   Alkaline Phosphatase 44 39 - 117 IU/L   AST 21 0 - 40 IU/L   ALT 23 0 - 44 IU/L  Basic metabolic panel  Result Value Ref Range   Glucose 82 65 - 99 mg/dL   BUN 16 8 - 27 mg/dL   Creatinine, Ser 0.85 0.76 - 1.27 mg/dL   GFR calc non Af Amer 90 >59 mL/min/1.73   GFR calc Af Amer 103 >59 mL/min/1.73   BUN/Creatinine Ratio 19 10 - 24   Sodium 142 134 - 144 mmol/L   Potassium 4.7 3.5 - 5.2 mmol/L   Chloride 103 96 - 106 mmol/L   CO2 25 20 - 29 mmol/L   Calcium 9.6 8.6 - 10.2 mg/dL  PSA  Result Value Ref Range   Prostate Specific Ag, Serum 3.0 0.0 - 4.0 ng/mL     Review of Systems  Constitutional: Negative for activity change, appetite change and fever.  HENT: Negative for congestion and rhinorrhea.   Eyes: Negative for discharge.  Respiratory: Negative for cough and wheezing.   Cardiovascular: Negative for chest pain.   Gastrointestinal: Negative for abdominal pain, blood in stool and vomiting.  Genitourinary: Negative for difficulty urinating and frequency.  Musculoskeletal: Negative for neck pain.  Skin: Negative for rash.  Allergic/Immunologic: Negative for environmental allergies and food allergies.  Neurological: Negative for weakness and headaches.  Psychiatric/Behavioral: Negative for agitation.  All other systems reviewed and are negative.      Objective:   Physical Exam Vitals signs reviewed.  Constitutional:      General: He is not in acute distress.    Appearance: He is well-developed.  HENT:     Head: Normocephalic.  Cardiovascular:     Rate and Rhythm: Normal rate and regular rhythm.     Heart sounds: Normal heart sounds. No murmur.  Pulmonary:     Effort: Pulmonary effort is normal.     Breath sounds: Normal breath sounds.  Skin:    General: Skin is warm and dry.  Neurological:     Mental Status: He is alert.  Psychiatric:        Behavior: Behavior normal.     Prostate exam within normal  limits      Assessment & Plan:  Impression wellness exam.  Up-to-date on colonoscopy.  Flu shot given.  Diet discussed.  Exercise discussed.  Having some residual neuropathic pain at site of shingles.  Slowly improving.  Symptomatic care discussed

## 2019-01-07 DIAGNOSIS — H524 Presbyopia: Secondary | ICD-10-CM | POA: Diagnosis not present

## 2019-01-07 DIAGNOSIS — H43813 Vitreous degeneration, bilateral: Secondary | ICD-10-CM | POA: Diagnosis not present

## 2019-01-30 DIAGNOSIS — H2511 Age-related nuclear cataract, right eye: Secondary | ICD-10-CM | POA: Diagnosis not present

## 2019-01-30 DIAGNOSIS — H43813 Vitreous degeneration, bilateral: Secondary | ICD-10-CM | POA: Diagnosis not present

## 2019-01-30 DIAGNOSIS — H25013 Cortical age-related cataract, bilateral: Secondary | ICD-10-CM | POA: Diagnosis not present

## 2019-01-30 DIAGNOSIS — H35033 Hypertensive retinopathy, bilateral: Secondary | ICD-10-CM | POA: Diagnosis not present

## 2019-01-30 DIAGNOSIS — H2513 Age-related nuclear cataract, bilateral: Secondary | ICD-10-CM | POA: Diagnosis not present

## 2019-02-18 DIAGNOSIS — H25811 Combined forms of age-related cataract, right eye: Secondary | ICD-10-CM | POA: Diagnosis not present

## 2019-02-18 DIAGNOSIS — H2511 Age-related nuclear cataract, right eye: Secondary | ICD-10-CM | POA: Diagnosis not present

## 2019-03-05 ENCOUNTER — Encounter: Payer: Self-pay | Admitting: Internal Medicine

## 2019-03-06 ENCOUNTER — Encounter: Payer: Self-pay | Admitting: Family Medicine

## 2019-03-06 DIAGNOSIS — H25012 Cortical age-related cataract, left eye: Secondary | ICD-10-CM | POA: Diagnosis not present

## 2019-03-06 DIAGNOSIS — H2512 Age-related nuclear cataract, left eye: Secondary | ICD-10-CM | POA: Diagnosis not present

## 2019-03-18 DIAGNOSIS — H2512 Age-related nuclear cataract, left eye: Secondary | ICD-10-CM | POA: Diagnosis not present

## 2019-03-18 DIAGNOSIS — H25812 Combined forms of age-related cataract, left eye: Secondary | ICD-10-CM | POA: Diagnosis not present

## 2019-03-18 DIAGNOSIS — H25012 Cortical age-related cataract, left eye: Secondary | ICD-10-CM | POA: Diagnosis not present

## 2019-03-31 NOTE — Progress Notes (Signed)
Cardiology Office Note  Date: 04/01/2019   ID: Miguel Morrow, Miguel Morrow 05-16-1950, MRN [id number]  PCP:  Miguel Kirschner, MD  Cardiologist: Satira Sark, MD Electrophysiologist:  None   Chief Complaint  Patient presents with  . Cardiac follow-up    History of Present Illness: Miguel Morrow is a 69 y.o. male last seen in the office back in 03-29-15.  He presents to the office overdue for previously arranged follow-up.  He has maintained visits with Dr. Wolfgang Phoenix in the interim, I reviewed the last note from November 2020.  He tells me that he retired back in Mar 28, 2016, previously worked in grocery produce.  His wife passed away in 28-Mar-2017.  He has been trying to stay busy with chores, taking care of grandchildren.  He does not report any active exertional chest pain or unusual shortness of breath, no palpitations or syncope.  I personally reviewed his ECG today which shows sinus bradycardia.  I reviewed his present medications which are outlined below.  Blood pressure is elevated today, it has been mildly elevated on previous healthcare checks.  He is currently not on antihypertensive therapy.  I did talk with him today about sodium restriction and exercise.  Estimated 10-year cardiovascular risk calculation is 21.7%.  LDL was 128 by lab work in October 2020.  Statin therapy would be a reasonable option to reduce his cardiovascular risk in general.  He previously did not tolerate Zocor.  He has had the coronavirus vaccine.  Past Medical History:  Diagnosis Date  . Bradycardia   . Nephrolithiasis 1984    Past Surgical History:  Procedure Laterality Date  . COLONOSCOPY N/A 09/26/2012   Procedure: COLONOSCOPY;  Surgeon: Daneil Dolin, MD;  Location: [facility];  Service: Endoscopy;  Laterality: N/A;  1:45  . COLONOSCOPY N/A 03/13/2018   Dr. Gala Romney: 6 polyps removed, tubular adenomas.  Diverticulosis.  Next colonoscopy 3 years.  . Kidney stone basket extraction  28-Mar-1982  . POLYPECTOMY   03/13/2018   Procedure: POLYPECTOMY;  Surgeon: Daneil Dolin, MD;  Location: [facility];  Service: Endoscopy;;  colon    Current Outpatient Medications  Medication Sig Dispense Refill  . aspirin 81 MG tablet Take 81 mg by mouth daily.    Marland Kitchen ibuprofen (ADVIL,MOTRIN) 200 MG tablet Take 200 mg by mouth 2 (two) times daily as needed for headache or moderate pain.    Marland Kitchen loratadine (CLARITIN) 10 MG tablet Take 10 mg by mouth daily as needed for allergies.    Marland Kitchen ondansetron (ZOFRAN ODT) 4 MG disintegrating tablet Dissolve one under toungue every 6 hours prn nausea. 20 tablet 0  . Probiotic Product (PROBIOTIC DAILY PO) Take by mouth.    . valACYclovir (VALTREX) 1000 MG tablet Take 1 tablet (1,000 mg total) by mouth 3 (three) times daily. 21 tablet 0   No current facility-administered medications for this visit.   Allergies:  Patient has no known allergies.   Social History: The patient  reports that he quit smoking about 11 years ago. His smoking use included cigarettes. He quit smokeless tobacco use about 10 years ago. He reports that he does not drink alcohol or use drugs.   Family History: The patient's family history includes Colon cancer in his paternal aunt; Heart disease in his father and paternal uncle.    ROS:   No palpitations or syncope.  Physical Exam: VS:  BP (!) 161/88   Pulse (!) 57   Temp 97.8 F (  36.6 C)   Ht 5' 7"  (1.702 m)   Wt 178 lb (80.7 kg)   SpO2 98%   BMI 27.88 kg/m , BMI Body mass index is 27.88 kg/m.  Wt Readings from Last 3 Encounters:  04/01/19 178 lb (80.7 kg)  12/24/18 172 lb (78 kg)  11/05/18 175 lb 12.8 oz (79.7 kg)    General: Patient appears comfortable at rest. HEENT: Conjunctiva and lids normal, wearing a mask. Neck: Supple, no elevated JVP or carotid bruits, no thyromegaly. Lungs: Clear to auscultation, nonlabored breathing at rest. Cardiac: Regular rate and rhythm, no S3 or significant systolic murmur, no pericardial rub. Abdomen: Soft,  nontender, bowel sounds present. Extremities: No pitting edema, distal pulses 2+. Skin: Warm and dry. Musculoskeletal: No kyphosis. Neuropsychiatric: Alert and oriented x3, affect grossly appropriate.  ECG:  An ECG dated 09/18/2014 was personally reviewed today and demonstrated:  Sinus bradycardia with increased voltage.  Recent Labwork: 05/21/2018: Magnesium 2.1 05/22/2018: Hemoglobin 12.9; Platelets 271 11/13/2018: ALT 23; AST 21; BUN 16; Creatinine, Ser 0.85; Potassium 4.7; Sodium 142     Component Value Date/Time   CHOL 209 (H) 11/13/2018 1011   TRIG 127 11/13/2018 1011   HDL 58 11/13/2018 1011   CHOLHDL 3.6 11/13/2018 1011   CHOLHDL 2.7 03/11/2015 0757   VLDL 13 03/11/2015 0757   LDLCALC 128 (H) 11/13/2018 1011    Other Studies Reviewed Today:  Exercise Cardiolite September 2014: Abnormal ST segment changes at maximum workload 13.4 METS without chest pain, burst of SVT and some PVCs, hypertensive response, perfusion imaging showing probable soft tissue attenuation in the anterior apex and inferior wall with no definite ischemia. LVEF was 53%. TID ratio was normal.  Assessment and Plan:  1.  Prior history of chest pain, no recurrence or change in exertional stamina.  Follow-up ECG reviewed and stable.  He does have a history of bradycardia, although not clearly symptomatic.  At this point no further cardiac testing is clearly indicated.  Estimated 10-year cardiovascular risk is 21.7% based on today's parameters and his lab work from October 2020.  This would suggest that statin therapy would provide reduction in this risk, although he does have prior intolerance to Zocor.  Would encourage follow-up and discussion with Dr. Wolfgang Phoenix regarding consideration of low-dose Crestor and aim to get his LDL down 50% if possible.  He is also tolerating low-dose aspirin.  2.  Elevated blood pressure.  Recommend follow-up with Dr. Wolfgang Phoenix.  He might be a good candidate for low-dose ARB or diuretic.   I also talked with him about salt restriction and exercise.  Medication Adjustments/Labs and Tests Ordered: Current medicines are reviewed at length with the patient today.  Concerns regarding medicines are outlined above.   Tests Ordered: Orders Placed This Encounter  Procedures  . EKG 12-Lead    Medication Changes: No orders of the defined types were placed in this encounter.   Disposition:  Follow up prn  Signed, Satira Sark, MD, [facility] 04/01/2019 11:41 AM    [place] [facility] [place]. [address], [place], [place] [address] Phone: [phone]; Fax: [phone]

## 2019-04-01 ENCOUNTER — Ambulatory Visit: Payer: Medicare HMO | Admitting: Cardiology

## 2019-04-01 ENCOUNTER — Other Ambulatory Visit: Payer: Self-pay

## 2019-04-01 ENCOUNTER — Encounter: Payer: Self-pay | Admitting: Cardiology

## 2019-04-01 VITALS — BP 161/88 | HR 57 | Temp 97.8°F | Ht 67.0 in | Wt 178.0 lb

## 2019-04-01 DIAGNOSIS — E782 Mixed hyperlipidemia: Secondary | ICD-10-CM | POA: Diagnosis not present

## 2019-04-01 DIAGNOSIS — Z87898 Personal history of other specified conditions: Secondary | ICD-10-CM

## 2019-04-01 DIAGNOSIS — R030 Elevated blood-pressure reading, without diagnosis of hypertension: Secondary | ICD-10-CM

## 2019-04-01 NOTE — Patient Instructions (Signed)
Medication Instructions:  Your physician recommends that you continue on your current medications as directed. Please refer to the Current Medication list given to you today.  *If you need a refill on your cardiac medications before your next appointment, please call your pharmacy*   Lab Work: None today If you have labs (blood work) drawn today and your tests are completely normal, you will receive your results only by: Marland Kitchen MyChart Message (if you have MyChart) OR . A paper copy in the mail If you have any lab test that is abnormal or we need to change your treatment, we will call you to review the results.   Testing/Procedures: None today   Follow-Up: At [facility], you and your health needs are our priority.  As part of our continuing mission to provide you with exceptional heart care, we have created designated Provider Care Teams.  These Care Teams include your primary Cardiologist (physician) and Advanced Practice Providers (APPs -  Physician Assistants and Nurse Practitioners) who all work together to provide you with the care you need, when you need it.  We recommend signing up for the patient portal called "MyChart".  Sign up information is provided on this After Visit Summary.  MyChart is used to connect with patients for Virtual Visits (Telemedicine).  Patients are able to view lab/test results, encounter notes, upcoming appointments, etc.  Non-urgent messages can be sent to your provider as well.   To learn more about what you can do with MyChart, go to [email or web address].    Your next appointment:      As needed with Dr.McDowell      Thank you for choosing [facility] !

## 2019-04-10 DIAGNOSIS — H524 Presbyopia: Secondary | ICD-10-CM | POA: Diagnosis not present

## 2019-06-09 ENCOUNTER — Other Ambulatory Visit: Payer: Self-pay

## 2019-06-09 ENCOUNTER — Telehealth (INDEPENDENT_AMBULATORY_CARE_PROVIDER_SITE_OTHER): Payer: Medicare HMO | Admitting: Family Medicine

## 2019-06-09 ENCOUNTER — Telehealth: Payer: Self-pay | Admitting: Family Medicine

## 2019-06-09 DIAGNOSIS — J329 Chronic sinusitis, unspecified: Secondary | ICD-10-CM

## 2019-06-09 DIAGNOSIS — J310 Chronic rhinitis: Secondary | ICD-10-CM | POA: Diagnosis not present

## 2019-06-09 MED ORDER — AMOXICILLIN 500 MG PO CAPS: 500.0000 mg | ORAL_CAPSULE | Freq: Three times a day (TID) | ORAL | 0 refills | Status: DC

## 2019-06-09 NOTE — Telephone Encounter (Signed)
[name], allum are scheduled for a virtual visit with your provider today.    Just as we do with appointments in the office, we must obtain your consent to participate.  Your consent will be active for this visit and any virtual visit you may have with one of our providers in the next 365 days.    If you have a MyChart account, I can also send a copy of this consent to you electronically.  All virtual visits are billed to your insurance company just like a traditional visit in the office.  As this is a virtual visit, video technology does not allow for your provider to perform a traditional examination.  This may limit your provider's ability to fully assess your condition.  If your provider identifies any concerns that need to be evaluated in person or the need to arrange testing such as labs, EKG, etc, we will make arrangements to do so.    Although advances in technology are sophisticated, we cannot ensure that it will always work on either your end or our end.  If the connection with a video visit is poor, we may have to switch to a telephone visit.  With either a video or telephone visit, we are not always able to ensure that we have a secure connection.   I need to obtain your verbal consent now.   Are you willing to proceed with your visit today?   Miguel Morrow has provided verbal consent on 06/09/2019 for a virtual visit (video or telephone).   [name], LPN 0/94/0768  0:88 PM

## 2019-06-09 NOTE — Progress Notes (Signed)
   Subjective:  Audio only  Patient ID: Miguel Morrow, male    DOB: 10/20/1950, 69 y.o.   MRN: [id number]  Sinusitis This is a new problem. Episode onset: last week. There has been no fever. (Shooting pain in head, sinus areas swollen yesterday, right eye puffy yesterday(almost closed).  Pt also having pain in top of head (pt states it feels like the pain when he had shingles). Pt having bumps in head, pain in head comes and goes. Pt had shingles in November. ) Treatments tried: sudafed, nasal spray, tylenol  The treatment provided mild (sudafed helped but increased BP) relief.   Virtual Visit via Telephone Note  I connected with Miguel Morrow on 06/09/19 at  3:30 PM EDT by telephone and verified that I am speaking with the correct person using two identifiers.  Location: Patient: home Provider: office   I discussed the limitations, risks, security and privacy concerns of performing an evaluation and management service by telephone and the availability of in person appointments. I also discussed with the patient that there may be a patient responsible charge related to this service. The patient expressed understanding and agreed to proceed.   History of Present Illness:    Observations/Objective:   Assessment and Plan:   Follow Up Instructions:    I discussed the assessment and treatment plan with the patient. The patient was provided an opportunity to ask questions and all were answered. The patient agreed with the plan and demonstrated an understanding of the instructions.   The patient was advised to call back or seek an in-person evaluation if the symptoms worsen or if the condition fails to improve as anticipated.  I provided 22 minutes of non-face-to-face time during this encounter.   Nasal congestion last several days.  Some headache.  Some drainage.  Has never had allergies before.  Headache is frontal in nature  Also some sharp discomfort in the area of his prior  shingles attack.  With some nonspecific tiny bumps.  No true rash    Review of Systems No chest pain no cough no shortness of breath no sore throat no fever    Objective:   Physical Exam  Virtual      Assessment & Plan:  Impression probable rhinosinusitis.  However with potential viral syndrome present recommend COVID-19 testing even though he has had the vaccine.  Antibiotics prescribed.  Symptom care discussed warning signs discussed

## 2019-10-09 DIAGNOSIS — R32 Unspecified urinary incontinence: Secondary | ICD-10-CM | POA: Diagnosis not present

## 2019-10-09 DIAGNOSIS — Z7982 Long term (current) use of aspirin: Secondary | ICD-10-CM | POA: Diagnosis not present

## 2019-10-09 DIAGNOSIS — R030 Elevated blood-pressure reading, without diagnosis of hypertension: Secondary | ICD-10-CM | POA: Diagnosis not present

## 2019-10-09 DIAGNOSIS — Z8249 Family history of ischemic heart disease and other diseases of the circulatory system: Secondary | ICD-10-CM | POA: Diagnosis not present

## 2019-10-09 DIAGNOSIS — Z87891 Personal history of nicotine dependence: Secondary | ICD-10-CM | POA: Diagnosis not present

## 2019-10-09 DIAGNOSIS — Z008 Encounter for other general examination: Secondary | ICD-10-CM | POA: Diagnosis not present

## 2019-10-09 DIAGNOSIS — J309 Allergic rhinitis, unspecified: Secondary | ICD-10-CM | POA: Diagnosis not present

## 2019-10-13 DIAGNOSIS — H43813 Vitreous degeneration, bilateral: Secondary | ICD-10-CM | POA: Diagnosis not present

## 2019-11-04 ENCOUNTER — Telehealth: Payer: Self-pay

## 2019-11-04 DIAGNOSIS — Z125 Encounter for screening for malignant neoplasm of prostate: Secondary | ICD-10-CM

## 2019-11-04 DIAGNOSIS — I10 Essential (primary) hypertension: Secondary | ICD-10-CM

## 2019-11-04 DIAGNOSIS — Z0000 Encounter for general adult medical examination without abnormal findings: Secondary | ICD-10-CM

## 2019-11-04 DIAGNOSIS — Z79899 Other long term (current) drug therapy: Secondary | ICD-10-CM

## 2019-11-04 DIAGNOSIS — E782 Mixed hyperlipidemia: Secondary | ICD-10-CM

## 2019-11-04 NOTE — Telephone Encounter (Signed)
Patient has physical on 11/9 and needing labs done

## 2019-11-04 NOTE — Telephone Encounter (Signed)
Last labs completed on 11/13/2018 PSA, BMET, HEPATIC and LIPID. Please advise. Thank you

## 2019-11-05 NOTE — Telephone Encounter (Signed)
Lab orders placed and pt is aware 

## 2019-11-05 NOTE — Telephone Encounter (Signed)
Pls order cbc, cmp, lipids, psa.  Thx. Dr. Lovena Le

## 2019-12-03 DIAGNOSIS — Z79899 Other long term (current) drug therapy: Secondary | ICD-10-CM | POA: Diagnosis not present

## 2019-12-03 DIAGNOSIS — Z125 Encounter for screening for malignant neoplasm of prostate: Secondary | ICD-10-CM | POA: Diagnosis not present

## 2019-12-03 DIAGNOSIS — E782 Mixed hyperlipidemia: Secondary | ICD-10-CM | POA: Diagnosis not present

## 2019-12-03 DIAGNOSIS — I10 Essential (primary) hypertension: Secondary | ICD-10-CM | POA: Diagnosis not present

## 2019-12-03 DIAGNOSIS — Z0000 Encounter for general adult medical examination without abnormal findings: Secondary | ICD-10-CM | POA: Diagnosis not present

## 2019-12-04 LAB — CBC WITH DIFFERENTIAL/PLATELET
Basophils Absolute: 0.1 10*3/uL (ref 0.0–0.2)
Basos: 2 %
EOS (ABSOLUTE): 0.3 10*3/uL (ref 0.0–0.4)
Eos: 4 %
Hematocrit: 47.2 % (ref 37.5–51.0)
Hemoglobin: 15.9 g/dL (ref 13.0–17.7)
Immature Grans (Abs): 0 10*3/uL (ref 0.0–0.1)
Immature Granulocytes: 0 %
Lymphocytes Absolute: 2.4 10*3/uL (ref 0.7–3.1)
Lymphs: 32 %
MCH: 30.6 pg (ref 26.6–33.0)
MCHC: 33.7 g/dL (ref 31.5–35.7)
MCV: 91 fL (ref 79–97)
Monocytes Absolute: 0.7 10*3/uL (ref 0.1–0.9)
Monocytes: 9 %
Neutrophils Absolute: 4 10*3/uL (ref 1.4–7.0)
Neutrophils: 53 %
Platelets: 217 10*3/uL (ref 150–450)
RBC: 5.2 x10E6/uL (ref 4.14–5.80)
RDW: 13.5 % (ref 11.6–15.4)
WBC: 7.5 10*3/uL (ref 3.4–10.8)

## 2019-12-04 LAB — LIPID PANEL
Chol/HDL Ratio: 4.5 ratio (ref 0.0–5.0)
Cholesterol, Total: 246 mg/dL — ABNORMAL HIGH (ref 100–199)
HDL: 55 mg/dL (ref >39)
LDL Chol Calc (NIH): 170 mg/dL — ABNORMAL HIGH (ref 0–99)
Triglycerides: 119 mg/dL (ref 0–149)
VLDL Cholesterol Cal: 21 mg/dL (ref 5–40)

## 2019-12-04 LAB — COMPREHENSIVE METABOLIC PANEL
ALT: 24 IU/L (ref 0–44)
AST: 22 IU/L (ref 0–40)
Albumin/Globulin Ratio: 1.6 (ref 1.2–2.2)
Albumin: 4.4 g/dL (ref 3.8–4.8)
Alkaline Phosphatase: 48 IU/L (ref 44–121)
BUN/Creatinine Ratio: 19 (ref 10–24)
BUN: 18 mg/dL (ref 8–27)
Bilirubin Total: 0.4 mg/dL (ref 0.0–1.2)
CO2: 21 mmol/L (ref 20–29)
Calcium: 9.4 mg/dL (ref 8.6–10.2)
Chloride: 105 mmol/L (ref 96–106)
Creatinine, Ser: 0.95 mg/dL (ref 0.76–1.27)
GFR calc Af Amer: 94 mL/min/{1.73_m2} (ref >59)
GFR calc non Af Amer: 81 mL/min/{1.73_m2} (ref >59)
Globulin, Total: 2.7 g/dL (ref 1.5–4.5)
Glucose: 88 mg/dL (ref 65–99)
Potassium: 4.4 mmol/L (ref 3.5–5.2)
Sodium: 141 mmol/L (ref 134–144)
Total Protein: 7.1 g/dL (ref 6.0–8.5)

## 2019-12-04 LAB — PSA: Prostate Specific Ag, Serum: 0.9 ng/mL (ref 0.0–4.0)

## 2019-12-09 ENCOUNTER — Ambulatory Visit (INDEPENDENT_AMBULATORY_CARE_PROVIDER_SITE_OTHER): Payer: Medicare HMO | Admitting: Family Medicine

## 2019-12-09 ENCOUNTER — Encounter: Payer: Self-pay | Admitting: Family Medicine

## 2019-12-09 ENCOUNTER — Other Ambulatory Visit: Payer: Self-pay

## 2019-12-09 VITALS — BP 138/76 | HR 69 | Temp 97.9°F | Ht 66.0 in | Wt 177.0 lb

## 2019-12-09 DIAGNOSIS — Z23 Encounter for immunization: Secondary | ICD-10-CM

## 2019-12-09 DIAGNOSIS — E782 Mixed hyperlipidemia: Secondary | ICD-10-CM

## 2019-12-09 DIAGNOSIS — Z0000 Encounter for general adult medical examination without abnormal findings: Secondary | ICD-10-CM

## 2019-12-09 DIAGNOSIS — I10 Essential (primary) hypertension: Secondary | ICD-10-CM

## 2019-12-09 NOTE — Progress Notes (Signed)
Patient ID: Miguel Morrow, male    DOB: 1951/01/21, 69 y.o.   MRN: [id number]   Chief Complaint  Patient presents with  . Annual Exam   Subjective:    HPI AWV- Annual Wellness Visit  The patient was seen for their annual wellness visit. The patient's past medical history, surgical history, and family history were reviewed. Pertinent vaccines were reviewed ( tetanus, pneumonia, shingles, flu) The patient's medication list was reviewed and updated.  The height and weight were entered.  BMI recorded in electronic record elsewhere  Cognitive screening was completed. Outcome of Mini - Cog: pass   Falls /depression screening electronically recorded within record elsewhere  Current tobacco usage: none (All patients who use tobacco were given written and verbal information on quitting)  Recent listing of emergency department/hospitalizations over the past year were reviewed.  current specialist the patient sees on a regular basis: dr Miguel Morrow    Medicare annual wellness visit patient questionnaire was reviewed.  A written screening schedule for the patient for the next 5-10 years was given. Appropriate discussion of followup regarding next visit was discussed.  Had shingles last year.  occ having pain on rt top of scalp. Taking tylenol for it. lasting mins a few times per week.  tyelnol prn. Helping. On rt eyebrow and forehead.  Had cataracts removed in past year.  hld-  Had fries today that were salty. Not add salt to foods. drinking 2 percent milk.  Does eat cheese.  Red meat.  Recommending low fat/chole diet. Former smoker 20 yrs.  Pt was on choletserol medication by heart doctor.  And having muscle joint pains.  Last pcp told him to stop it. Cards-seen Dr. Domenic Morrow, and heart check was normal. Had elevated blood pressure at that visit.  At home seeing 148/70 at home. Having some stress at time taking it.   Medical History Miguel Morrow has a past medical history of  Bradycardia and Nephrolithiasis (1984).   Outpatient Encounter Medications as of 12/09/2019  Medication Sig  . acetaminophen (TYLENOL) 500 MG tablet Take 500 mg by mouth every 6 (six) hours as needed.  Marland Kitchen aspirin 81 MG tablet Take 81 mg by mouth daily.  . Multiple Vitamin (MULTIVITAMIN) tablet Take 1 tablet by mouth daily.  . Probiotic Product (PROBIOTIC DAILY PO) Take by mouth.  . [DISCONTINUED] ibuprofen (ADVIL,MOTRIN) 200 MG tablet Take 200 mg by mouth 2 (two) times daily as needed for headache or moderate pain.  . [DISCONTINUED] amoxicillin (AMOXIL) 500 MG capsule Take 1 capsule (500 mg total) by mouth 3 (three) times daily.  . [DISCONTINUED] loratadine (CLARITIN) 10 MG tablet Take 10 mg by mouth daily as needed for allergies.   No facility-administered encounter medications on file as of 12/09/2019.     Review of Systems  Constitutional: Negative for chills and fever.  HENT: Negative for congestion, rhinorrhea and sore throat.   Respiratory: Negative for cough, shortness of breath and wheezing.   Cardiovascular: Negative for chest pain and leg swelling.  Gastrointestinal: Negative for abdominal pain, diarrhea, nausea and vomiting.  Genitourinary: Negative for dysuria and frequency.  Skin: Negative for rash.  Neurological: Negative for dizziness, weakness and headaches.     Vitals BP 138/76   Pulse 69   Temp 97.9 F (36.6 C)   Ht 5' 6"  (1.676 m)   Wt 177 lb (80.3 kg)   SpO2 98%   BMI 28.57 kg/m   Objective:   Physical Exam Vitals and nursing note reviewed.  Constitutional:  General: He is not in acute distress.    Appearance: Normal appearance. He is not ill-appearing.  HENT:     Head: Normocephalic.     Nose: Nose normal. No congestion.     Mouth/Throat:     Mouth: Mucous membranes are moist.     Pharynx: No oropharyngeal exudate.  Eyes:     Extraocular Movements: Extraocular movements intact.     Conjunctiva/sclera: Conjunctivae normal.     Pupils: Pupils  are equal, round, and reactive to light.  Cardiovascular:     Rate and Rhythm: Normal rate and regular rhythm.     Pulses: Normal pulses.     Heart sounds: Normal heart sounds. No murmur heard.   Pulmonary:     Effort: Pulmonary effort is normal.     Breath sounds: Normal breath sounds. No wheezing, rhonchi or rales.  Musculoskeletal:        General: Normal range of motion.     Right lower leg: No edema.     Left lower leg: No edema.  Skin:    General: Skin is warm and dry.     Findings: No rash.  Neurological:     General: No focal deficit present.     Mental Status: He is alert and oriented to person, place, and time.     Cranial Nerves: No cranial nerve deficit.  Psychiatric:        Mood and Affect: Mood normal.        Behavior: Behavior normal.        Thought Content: Thought content normal.        Judgment: Judgment normal.      Assessment and Plan   1. Medicare annual wellness visit, subsequent  2. Need for vaccination - Flu Vaccine QUAD High Dose(Fluad)  3. Essential hypertension  4. Mixed hyperlipidemia  hld-   12% risk on ascvd to and recommending lifestyle modifications with inc in exercising dec salt and eat low cholesterol diet.   Reviewed this with pt needing to make dietary changes.   htn- suboptimal cont to watch salt in diet. Cont to monitor.  Not currently on meds. Check bp at home call if seeing numbers over 150/85.   HM- up to date.  Will get tdap from health dept.  F/u 30mo

## 2019-12-09 NOTE — Patient Instructions (Signed)
Call if seeing high bp numbers, if seeing 140/85 and above.  Call office.  Wanting bp around 120/80.

## 2020-06-07 ENCOUNTER — Telehealth (INDEPENDENT_AMBULATORY_CARE_PROVIDER_SITE_OTHER): Payer: Medicare HMO | Admitting: Family Medicine

## 2020-06-07 ENCOUNTER — Other Ambulatory Visit: Payer: Self-pay

## 2020-06-07 DIAGNOSIS — B349 Viral infection, unspecified: Secondary | ICD-10-CM

## 2020-06-07 DIAGNOSIS — E782 Mixed hyperlipidemia: Secondary | ICD-10-CM

## 2020-06-07 NOTE — Progress Notes (Signed)
Patient ID: Miguel Morrow, male    DOB: 1950/02/01, 70 y.o.   MRN: [id number]   Virtual Visit via Telephone Note  I connected with Miguel Morrow on 06/07/20 at  1:10 PM EDT by telephone and verified that I am speaking with the correct person using two identifiers.  Location: Patient: home Provider: home   I discussed the limitations, risks, security and privacy concerns of performing an evaluation and management service by telephone and the availability of in person appointments. I also discussed with the patient that there may be a patient responsible charge related to this service. The patient expressed understanding and agreed to proceed.  Chief Complaint  Patient presents with  . Hyperlipidemia   Subjective:    HPI  F/u HLD.   Pt having new concern of uri symptoms- Pt having a lot of sinus issues and taking otc meds.  Helping.  Runny nose, congestion, and sneezing.  Pain in sinuses and occ causing headache.  No fever.  occ sore throat. meds- taking lozenges and helping. Getting better over time. Lost his voice the last week. Pt was taking sudafed, and stopped it because he heard it would raise bp.  HLD- making some diet changes. And increasing exercising 1 mile per day.  H/o htn in past- but not currently on any medications.  Taking probiotic for intestines since last colonoscopy. Taking baby asa.   Medical History Miguel Morrow has a past medical history of Bradycardia and Nephrolithiasis (1984).   Outpatient Encounter Medications as of 06/07/2020  Medication Sig  . acetaminophen (TYLENOL) 500 MG tablet Take 500 mg by mouth every 6 (six) hours as needed.  Marland Kitchen aspirin 81 MG tablet Take 81 mg by mouth daily.  . Multiple Vitamin (MULTIVITAMIN) tablet Take 1 tablet by mouth daily.  . Probiotic Product (PROBIOTIC DAILY PO) Take by mouth.   No facility-administered encounter medications on file as of 06/07/2020.     Review of Systems  Constitutional: Negative for chills and  fever.  HENT: Positive for sinus pressure, sinus pain and voice change (resolved). Negative for congestion, rhinorrhea and sore throat.   Respiratory: Negative for cough, shortness of breath and wheezing.   Cardiovascular: Negative for chest pain and leg swelling.  Gastrointestinal: Negative for abdominal pain, diarrhea, nausea and vomiting.  Genitourinary: Negative for dysuria and frequency.  Skin: Negative for rash.  Neurological: Negative for dizziness, weakness and headaches.     Vitals There were no vitals taken for this visit.  Objective:   Physical Exam  No PE due to phone visit.  Assessment and Plan   1. Mixed hyperlipidemia - Lipid panel  2. Viral illness   hld- uncontrolled, since last visit,  Will recheck labs. Cont to do diet modifications and increase in exercising as tolerated. Will recheck cholesterol this week.    -pt to call back if worsening allergies, cold symptoms, or sinus congestion. -cont otc meds and avoid sudafed and meds that increase blood pressure.  -pt to check bp at home and call if seeing numbers over 140/85 or above.   Return in about 6 months (around 12/08/2020) for f/u htn/medicare wellness.  BP Readings from Last 3 Encounters:  12/09/19 138/76  04/01/19 (!) 161/88  12/24/18 138/86     Follow Up Instructions:    I discussed the assessment and treatment plan with the patient. The patient was provided an opportunity to ask questions and all were answered. The patient agreed with the plan and demonstrated an understanding of  the instructions.   The patient was advised to call back or seek an in-person evaluation if the symptoms worsen or if the condition fails to improve as anticipated.  I provided 13 minutes of non-face-to-face time during this encounter.   [place], DO

## 2020-06-25 DIAGNOSIS — E782 Mixed hyperlipidemia: Secondary | ICD-10-CM | POA: Diagnosis not present

## 2020-06-26 LAB — LIPID PANEL
Chol/HDL Ratio: 4.1 ratio (ref 0.0–5.0)
Cholesterol, Total: 172 mg/dL (ref 100–199)
HDL: 42 mg/dL (ref >39)
LDL Chol Calc (NIH): 117 mg/dL — ABNORMAL HIGH (ref 0–99)
Triglycerides: 70 mg/dL (ref 0–149)
VLDL Cholesterol Cal: 13 mg/dL (ref 5–40)

## 2020-06-30 ENCOUNTER — Other Ambulatory Visit: Payer: Self-pay

## 2020-06-30 ENCOUNTER — Ambulatory Visit (INDEPENDENT_AMBULATORY_CARE_PROVIDER_SITE_OTHER): Payer: Medicare HMO | Admitting: Family Medicine

## 2020-06-30 VITALS — BP 135/71 | HR 82 | Temp 97.8°F | Ht 66.0 in | Wt 173.4 lb

## 2020-06-30 DIAGNOSIS — J0100 Acute maxillary sinusitis, unspecified: Secondary | ICD-10-CM | POA: Diagnosis not present

## 2020-06-30 DIAGNOSIS — E782 Mixed hyperlipidemia: Secondary | ICD-10-CM

## 2020-06-30 DIAGNOSIS — I10 Essential (primary) hypertension: Secondary | ICD-10-CM | POA: Diagnosis not present

## 2020-06-30 MED ORDER — AMOXICILLIN 500 MG PO CAPS: 500.0000 mg | ORAL_CAPSULE | Freq: Three times a day (TID) | ORAL | 0 refills | Status: DC

## 2020-06-30 NOTE — Patient Instructions (Signed)

## 2020-06-30 NOTE — Progress Notes (Signed)
Patient ID: Miguel Morrow, male    DOB: 1950-06-11, 70 y.o.   MRN: [id number]   Chief Complaint  Patient presents with   Hyperlipidemia    Follow up Still having sinus problems   Subjective:    HPI  HLD- doing well no new concerns.  Diet modification.  Not on meds. No chest pain, palpitations, myalgias or joint pains.  Walking 2 miles per day and made diet changes and helped his cholesterol numbers.   Sinus issues- occ gets bad and taking tylenol sinus/headache meds. No fever, throat pain, or coughing.  Checking at home seeing [id number] systolic, checking last 6 weeks.  Medical History Rubel has a past medical history of Bradycardia and Nephrolithiasis (1984).   Outpatient Encounter Medications as of 06/30/2020  Medication Sig   acetaminophen (TYLENOL) 500 MG tablet Take 500 mg by mouth every 6 (six) hours as needed.   amoxicillin (AMOXIL) 500 MG capsule Take 1 capsule (500 mg total) by mouth 3 (three) times daily.   aspirin 81 MG tablet Take 81 mg by mouth daily.   Multiple Vitamin (MULTIVITAMIN) tablet Take 1 tablet by mouth daily.   Probiotic Product (PROBIOTIC DAILY PO) Take by mouth.   No facility-administered encounter medications on file as of 06/30/2020.     Review of Systems  Constitutional:  Negative for chills and fever.  HENT:  Positive for congestion and sinus pressure. Negative for rhinorrhea and sore throat.   Respiratory:  Negative for cough, shortness of breath and wheezing.   Cardiovascular:  Negative for chest pain and leg swelling.  Gastrointestinal:  Negative for abdominal pain, diarrhea, nausea and vomiting.  Genitourinary:  Negative for dysuria and frequency.  Skin:  Negative for rash.  Neurological:  Positive for headaches. Negative for dizziness and weakness.    Vitals BP 135/71   Pulse 82   Temp 97.8 F (36.6 C) (Oral)   Ht 5' 6"  (1.676 m)   Wt 173 lb 6.4 oz (78.7 kg)   SpO2 98%   BMI 27.99 kg/m   Objective:   Physical Exam Vitals and  nursing note reviewed.  Constitutional:      General: He is not in acute distress.    Appearance: Normal appearance. He is not ill-appearing.  HENT:     Head: Normocephalic.     Nose: Nose normal. No congestion or rhinorrhea.     Mouth/Throat:     Mouth: Mucous membranes are moist.     Pharynx: No oropharyngeal exudate.  Eyes:     Extraocular Movements: Extraocular movements intact.     Conjunctiva/sclera: Conjunctivae normal.     Pupils: Pupils are equal, round, and reactive to light.  Cardiovascular:     Rate and Rhythm: Normal rate and regular rhythm.     Pulses: Normal pulses.     Heart sounds: Normal heart sounds. No murmur heard. Pulmonary:     Effort: Pulmonary effort is normal.     Breath sounds: Normal breath sounds. No wheezing, rhonchi or rales.  Musculoskeletal:        General: Normal range of motion.     Right lower leg: No edema.     Left lower leg: No edema.  Skin:    General: Skin is warm and dry.     Findings: No rash.  Neurological:     General: No focal deficit present.     Mental Status: He is alert and oriented to person, place, and time.     Cranial Nerves: No  cranial nerve deficit.  Psychiatric:        Mood and Affect: Mood normal.        Behavior: Behavior normal.        Thought Content: Thought content normal.        Judgment: Judgment normal.     Assessment and Plan   1. Acute non-recurrent maxillary sinusitis - amoxicillin (AMOXIL) 500 MG capsule; Take 1 capsule (500 mg total) by mouth 3 (three) times daily.  Dispense: 30 capsule; Refill: 0  2. Mixed hyperlipidemia  3. Essential hypertension   Htn- stable. Slight elevation and pt to cont to watch salt intake.  No on meds currently.  Hld- stable. Controlled  Not on meds.  Cont to monitor. Has statin intolerance.    Return in about 6 months (around 12/30/2020) for f/u reck bp, HLD.  BP Readings from Last 3 Encounters:  06/30/20 135/71  12/09/19 138/76  04/01/19 (!) 161/88

## 2020-07-13 ENCOUNTER — Encounter: Payer: Self-pay | Admitting: Family Medicine

## 2020-08-30 ENCOUNTER — Telehealth: Payer: Self-pay | Admitting: Family Medicine

## 2020-08-30 NOTE — Telephone Encounter (Signed)
Patient tested positive today with Covid has a bad cough symptoms started on 7/28. He is requesting anti-viral medication called in [facility]-[phone]

## 2020-08-30 NOTE — Telephone Encounter (Signed)
Pt contacted. Pt informed we do not have any openings. Pt advised to do E-Visit if having issues. Pt states he is just having cough at this time. Pt verbalized understanding (and very respectful even though we had no openings)Pt advised to go to ER if he becomes short of breath, trouble breathing or troublesome issue.

## 2020-10-07 DIAGNOSIS — R030 Elevated blood-pressure reading, without diagnosis of hypertension: Secondary | ICD-10-CM | POA: Diagnosis not present

## 2020-10-07 DIAGNOSIS — Z125 Encounter for screening for malignant neoplasm of prostate: Secondary | ICD-10-CM | POA: Diagnosis not present

## 2020-10-07 DIAGNOSIS — M792 Neuralgia and neuritis, unspecified: Secondary | ICD-10-CM | POA: Diagnosis not present

## 2020-10-07 DIAGNOSIS — Z0189 Encounter for other specified special examinations: Secondary | ICD-10-CM | POA: Diagnosis not present

## 2020-10-07 DIAGNOSIS — R001 Bradycardia, unspecified: Secondary | ICD-10-CM | POA: Diagnosis not present

## 2020-10-07 DIAGNOSIS — Z23 Encounter for immunization: Secondary | ICD-10-CM | POA: Diagnosis not present

## 2020-11-05 DIAGNOSIS — Z131 Encounter for screening for diabetes mellitus: Secondary | ICD-10-CM | POA: Diagnosis not present

## 2020-11-05 DIAGNOSIS — E782 Mixed hyperlipidemia: Secondary | ICD-10-CM | POA: Diagnosis not present

## 2020-11-05 DIAGNOSIS — Z125 Encounter for screening for malignant neoplasm of prostate: Secondary | ICD-10-CM | POA: Diagnosis not present

## 2020-11-05 DIAGNOSIS — R030 Elevated blood-pressure reading, without diagnosis of hypertension: Secondary | ICD-10-CM | POA: Diagnosis not present

## 2020-11-08 DIAGNOSIS — R001 Bradycardia, unspecified: Secondary | ICD-10-CM | POA: Diagnosis not present

## 2020-11-08 DIAGNOSIS — Z0001 Encounter for general adult medical examination with abnormal findings: Secondary | ICD-10-CM | POA: Diagnosis not present

## 2020-11-08 DIAGNOSIS — E782 Mixed hyperlipidemia: Secondary | ICD-10-CM | POA: Diagnosis not present

## 2020-11-08 DIAGNOSIS — R030 Elevated blood-pressure reading, without diagnosis of hypertension: Secondary | ICD-10-CM | POA: Diagnosis not present

## 2020-11-08 DIAGNOSIS — L821 Other seborrheic keratosis: Secondary | ICD-10-CM | POA: Diagnosis not present

## 2020-11-08 DIAGNOSIS — Z125 Encounter for screening for malignant neoplasm of prostate: Secondary | ICD-10-CM | POA: Diagnosis not present

## 2020-11-08 DIAGNOSIS — M792 Neuralgia and neuritis, unspecified: Secondary | ICD-10-CM | POA: Diagnosis not present

## 2020-11-08 DIAGNOSIS — Z23 Encounter for immunization: Secondary | ICD-10-CM | POA: Diagnosis not present

## 2020-11-22 DIAGNOSIS — I781 Nevus, non-neoplastic: Secondary | ICD-10-CM | POA: Diagnosis not present

## 2020-11-22 DIAGNOSIS — L821 Other seborrheic keratosis: Secondary | ICD-10-CM | POA: Diagnosis not present

## 2021-01-18 ENCOUNTER — Encounter (HOSPITAL_COMMUNITY): Payer: Self-pay | Admitting: *Deleted

## 2021-01-18 ENCOUNTER — Emergency Department (HOSPITAL_COMMUNITY): Payer: Medicare HMO

## 2021-01-18 ENCOUNTER — Emergency Department (HOSPITAL_COMMUNITY)
Admission: EM | Admit: 2021-01-18 | Discharge: 2021-01-18 | Disposition: A | Discharge status: Home / Self Care | Payer: Medicare HMO | Attending: Emergency Medicine | Admitting: Emergency Medicine

## 2021-01-18 ENCOUNTER — Other Ambulatory Visit: Payer: Self-pay

## 2021-01-18 DIAGNOSIS — Z87891 Personal history of nicotine dependence: Secondary | ICD-10-CM | POA: Diagnosis not present

## 2021-01-18 DIAGNOSIS — R29810 Facial weakness: Secondary | ICD-10-CM | POA: Diagnosis not present

## 2021-01-18 DIAGNOSIS — Z20822 Contact with and (suspected) exposure to covid-19: Secondary | ICD-10-CM | POA: Diagnosis not present

## 2021-01-18 DIAGNOSIS — Z7982 Long term (current) use of aspirin: Secondary | ICD-10-CM | POA: Insufficient documentation

## 2021-01-18 DIAGNOSIS — R030 Elevated blood-pressure reading, without diagnosis of hypertension: Secondary | ICD-10-CM

## 2021-01-18 DIAGNOSIS — I10 Essential (primary) hypertension: Secondary | ICD-10-CM | POA: Diagnosis not present

## 2021-01-18 LAB — RESP PANEL BY RT-PCR (FLU A&B, COVID) ARPGX2
Influenza A by PCR: NEGATIVE
Influenza B by PCR: NEGATIVE
SARS Coronavirus 2 by RT PCR: NEGATIVE

## 2021-01-18 LAB — DIFFERENTIAL
Abs Immature Granulocytes: 0.03 10*3/uL (ref 0.00–0.07)
Basophils Absolute: 0.1 10*3/uL (ref 0.0–0.1)
Basophils Relative: 1 %
Eosinophils Absolute: 0.3 10*3/uL (ref 0.0–0.5)
Eosinophils Relative: 4 %
Immature Granulocytes: 0 %
Lymphocytes Relative: 29 %
Lymphs Abs: 2.5 10*3/uL (ref 0.7–4.0)
Monocytes Absolute: 0.8 10*3/uL (ref 0.1–1.0)
Monocytes Relative: 10 %
Neutro Abs: 4.9 10*3/uL (ref 1.7–7.7)
Neutrophils Relative %: 56 %

## 2021-01-18 LAB — CBC
HCT: 45 % (ref 39.0–52.0)
Hemoglobin: 15.1 g/dL (ref 13.0–17.0)
MCH: 31 pg (ref 26.0–34.0)
MCHC: 33.6 g/dL (ref 30.0–36.0)
MCV: 92.4 fL (ref 80.0–100.0)
Platelets: 242 10*3/uL (ref 150–400)
RBC: 4.87 MIL/uL (ref 4.22–5.81)
RDW: 13.7 % (ref 11.5–15.5)
WBC: 8.7 10*3/uL (ref 4.0–10.5)
nRBC: 0 % (ref 0.0–0.2)

## 2021-01-18 LAB — COMPREHENSIVE METABOLIC PANEL
ALT: 26 U/L (ref 0–44)
AST: 21 U/L (ref 15–41)
Albumin: 4.1 g/dL (ref 3.5–5.0)
Alkaline Phosphatase: 39 U/L (ref 38–126)
Anion gap: 9 (ref 5–15)
BUN: 21 mg/dL (ref 8–23)
CO2: 25 mmol/L (ref 22–32)
Calcium: 9.1 mg/dL (ref 8.9–10.3)
Chloride: 104 mmol/L (ref 98–111)
Creatinine, Ser: 0.93 mg/dL (ref 0.61–1.24)
GFR, Estimated: >60 mL/min (ref >60)
Glucose, Bld: 82 mg/dL (ref 70–99)
Potassium: 4.2 mmol/L (ref 3.5–5.1)
Sodium: 138 mmol/L (ref 135–145)
Total Bilirubin: 0.5 mg/dL (ref 0.3–1.2)
Total Protein: 7.4 g/dL (ref 6.5–8.1)

## 2021-01-18 LAB — ETHANOL: Alcohol, Ethyl (B): <10 mg/dL (ref <10)

## 2021-01-18 LAB — PROTIME-INR
INR: 1 (ref 0.8–1.2)
Prothrombin Time: 12.8 seconds (ref 11.4–15.2)

## 2021-01-18 LAB — APTT: aPTT: 30 seconds (ref 24–36)

## 2021-01-18 NOTE — ED Notes (Signed)
Spoke with EDP to ensure that pt was not a code stroke. EDP stated no activation of code stroke or teleneurology.

## 2021-01-18 NOTE — ED Provider Notes (Signed)
[facility] EMERGENCY DEPARTMENT Provider Note   CSN: [id number] Arrival date & time: 01/18/21  1707     History Chief Complaint  Patient presents with   Facial Droop    Miguel Morrow is a 70 y.o. male.  Pt sent to ED by his doctor for MRI to r/o cva. Patient indicates felt right face was 'droopy' since yesterday. Symptoms acute onset, mild, persistent. No facial pain. No numbness. No eye pain or irritation. No drooling or trouble eating or swallowing. No extremity numbness/weakness. No problems w balance or coordination. No problems with speech or vision. Family member notes patient had shingles to right face last year, states not sure if related, also notes pt with recent stress.   The history is provided by the patient, medical records and a relative.      Past Medical History:  Diagnosis Date   Bradycardia    Nephrolithiasis 1984    Patient Active Problem List   Diagnosis Date Noted   Pancolitis ([place]) 05/21/2018   Essential hypertension 10/31/2016   Osteoarthritis of right knee 02/09/2014   Ulnar neuropathy at elbow of left upper extremity 02/09/2014   Rash and nonspecific skin eruption 02/09/2014   Abnormal cardiovascular stress test 11/21/2012   Precordial pain 10/22/2012   Bradycardia 10/03/2012   Hemorrhoid 09/24/2012   Bowel habit changes 09/24/2012   Mixed hyperlipidemia 08/20/2012    Past Surgical History:  Procedure Laterality Date   COLONOSCOPY N/A 09/26/2012   Procedure: COLONOSCOPY;  Surgeon: Daneil Dolin, MD;  Location: [facility];  Service: Endoscopy;  Laterality: N/A;  1:45   COLONOSCOPY N/A 03/13/2018   Dr. Gala Romney: 6 polyps removed, tubular adenomas.  Diverticulosis.  Next colonoscopy 3 years.   Kidney stone basket extraction  1984   POLYPECTOMY  03/13/2018   Procedure: POLYPECTOMY;  Surgeon: Daneil Dolin, MD;  Location: [facility];  Service: Endoscopy;;  colon       Family History  Problem Relation Age of Onset   Heart disease  Father        Died age 20   Colon cancer Paternal Aunt        Greater than age of 39   Heart disease Paternal Uncle    Liver disease Neg Hx     Social History   Tobacco Use   Smoking status: Former    Types: Cigarettes    Quit date: 01/31/2008    Years since quitting: 12.9   Smokeless tobacco: Former    Quit date: 05/17/2008  Vaping Use   Vaping Use: Never used  Substance Use Topics   Alcohol use: No    Alcohol/week: 0.0 standard drinks   Drug use: No    Home Medications Prior to Admission medications   Medication Sig Start Date End Date Taking? Authorizing Provider  acetaminophen (TYLENOL) 500 MG tablet Take 500 mg by mouth every 6 (six) hours as needed.    [provider]  amoxicillin (AMOXIL) 500 MG capsule Take 1 capsule (500 mg total) by mouth 3 (three) times daily. 06/30/20   Elvia Collum M, DO  aspirin 81 MG tablet Take 81 mg by mouth daily.    [provider]  Multiple Vitamin (MULTIVITAMIN) tablet Take 1 tablet by mouth daily.    [provider]  Probiotic Product (PROBIOTIC DAILY PO) Take by mouth.    [provider]    Allergies    Statins  Review of Systems   Review of Systems  Constitutional:  Negative for fever.  HENT:  Negative for trouble swallowing.   Eyes:  Negative for pain, redness and visual disturbance.  Respiratory:  Negative for shortness of breath.   Cardiovascular:  Negative for chest pain.  Gastrointestinal:  Negative for abdominal pain.  Genitourinary:  Negative for flank pain.  Musculoskeletal:  Negative for neck pain.  Skin:  Negative for rash.  Neurological:  Negative for speech difficulty, numbness and headaches.  Hematological:  Does not bruise/bleed easily.  Psychiatric/Behavioral:  Negative for confusion.    Physical Exam Updated Vital Signs BP (!) 190/72 (BP Location: Right Arm)    Pulse (!) 50    Temp 97.6 F (36.4 C) (Oral)    Resp 16    SpO2 98%   Physical Exam Vitals and nursing note  reviewed.  Constitutional:      Appearance: Normal appearance. He is well-developed.  HENT:     Head: Atraumatic.     Nose: Nose normal.     Mouth/Throat:     Mouth: Mucous membranes are moist.  Eyes:     General: No scleral icterus.    Extraocular Movements: Extraocular movements intact.     Conjunctiva/sclera: Conjunctivae normal.     Pupils: Pupils are equal, round, and reactive to light.  Neck:     Vascular: No carotid bruit.     Trachea: No tracheal deviation.  Cardiovascular:     Rate and Rhythm: Normal rate and regular rhythm.     Pulses: Normal pulses.     Heart sounds: Normal heart sounds. No murmur heard.   No friction rub. No gallop.  Pulmonary:     Effort: Pulmonary effort is normal. No accessory muscle usage or respiratory distress.  Abdominal:     General: There is no distension.     Tenderness: There is no abdominal tenderness.  Genitourinary:    Comments: No cva tenderness. Musculoskeletal:        General: No swelling.     Cervical back: Normal range of motion and neck supple. No rigidity.  Skin:    General: Skin is warm and dry.     Findings: No rash.  Neurological:     Mental Status: He is alert.     Cranial Nerves: No cranial nerve deficit.     Comments: Alert, speech clear. Motor/sens grossly intact bil, str 5/5.  No pronator drift. Steady gait.   Psychiatric:        Mood and Affect: Mood normal.    ED Results / Procedures / Treatments   Labs (all labs ordered are listed, but only abnormal results are displayed) Results for orders placed or performed during the hospital encounter of 01/18/21  Resp Panel by RT-PCR (Flu A&B, Covid) Nasopharyngeal Swab   Specimen: Nasopharyngeal Swab; Nasopharyngeal(NP) swabs in vial transport medium  Result Value Ref Range   SARS Coronavirus 2 by RT PCR NEGATIVE NEGATIVE   Influenza A by PCR NEGATIVE NEGATIVE   Influenza B by PCR NEGATIVE NEGATIVE  Ethanol  Result Value Ref Range   Alcohol, Ethyl (B) <10 <10  mg/dL  Protime-INR  Result Value Ref Range   Prothrombin Time 12.8 11.4 - 15.2 seconds   INR 1.0 0.8 - 1.2  APTT  Result Value Ref Range   aPTT 30 24 - 36 seconds  CBC  Result Value Ref Range   WBC 8.7 4.0 - 10.5 K/uL   RBC 4.87 4.22 - 5.81 MIL/uL   Hemoglobin 15.1 13.0 - 17.0 g/dL   HCT 45.0 39.0 -  52.0 %   MCV 92.4 80.0 - 100.0 fL   MCH 31.0 26.0 - 34.0 pg   MCHC 33.6 30.0 - 36.0 g/dL   RDW 13.7 11.5 - 15.5 %   Platelets 242 150 - 400 K/uL   nRBC 0.0 0.0 - 0.2 %  Differential  Result Value Ref Range   Neutrophils Relative % 56 %   Neutro Abs 4.9 1.7 - 7.7 K/uL   Lymphocytes Relative 29 %   Lymphs Abs 2.5 0.7 - 4.0 K/uL   Monocytes Relative 10 %   Monocytes Absolute 0.8 0.1 - 1.0 K/uL   Eosinophils Relative 4 %   Eosinophils Absolute 0.3 0.0 - 0.5 K/uL   Basophils Relative 1 %   Basophils Absolute 0.1 0.0 - 0.1 K/uL   Immature Granulocytes 0 %   Abs Immature Granulocytes 0.03 0.00 - 0.07 K/uL  Comprehensive metabolic panel  Result Value Ref Range   Sodium 138 135 - 145 mmol/L   Potassium 4.2 3.5 - 5.1 mmol/L   Chloride 104 98 - 111 mmol/L   CO2 25 22 - 32 mmol/L   Glucose, Bld 82 70 - 99 mg/dL   BUN 21 8 - 23 mg/dL   Creatinine, Ser 0.93 0.61 - 1.24 mg/dL   Calcium 9.1 8.9 - 10.3 mg/dL   Total Protein 7.4 6.5 - 8.1 g/dL   Albumin 4.1 3.5 - 5.0 g/dL   AST 21 15 - 41 U/L   ALT 26 0 - 44 U/L   Alkaline Phosphatase 39 38 - 126 U/L   Total Bilirubin 0.5 0.3 - 1.2 mg/dL   GFR, Estimated >60 >60 mL/min   Anion gap 9 5 - 15   CT HEAD WO CONTRAST  Result Date: 01/18/2021 CLINICAL DATA:  Right-sided facial droop for 2 days, initial encounter EXAM: CT HEAD WITHOUT CONTRAST TECHNIQUE: Contiguous axial images were obtained from the base of the skull through the vertex without intravenous contrast. COMPARISON:  None. FINDINGS: Brain: No evidence of acute infarction, hemorrhage, hydrocephalus, extra-axial collection or mass lesion/mass effect. Vascular: No hyperdense vessel  or unexpected calcification. Skull: Normal. Negative for fracture or focal lesion. Sinuses/Orbits: No acute finding. Other: None. IMPRESSION: No acute abnormality noted. Electronically Signed   By: Inez Catalina M.D.   On: 01/18/2021 17:59   MR BRAIN WO CONTRAST  Result Date: 01/18/2021 CLINICAL DATA:  Right facial droop.  Acute neuro deficit EXAM: MRI HEAD WITHOUT CONTRAST TECHNIQUE: Multiplanar, multiecho pulse sequences of the brain and surrounding structures were obtained without intravenous contrast. COMPARISON:  CT head 01/18/2021 FINDINGS: Brain: No acute infarction, hemorrhage, hydrocephalus, extra-axial collection or mass lesion. Mild white matter changes with small white matter hyperintensities bilaterally. Brainstem normal. Vascular: Normal arterial flow voids Skull and upper cervical spine: No focal skeletal abnormality. Sinuses/Orbits: Retention cyst right maxillary sinus. Remaining sinuses clear. Bilateral cataract extraction Other: None IMPRESSION: No acute abnormality. Mild white matter changes consistent with chronic microvascular ischemia. Electronically Signed   By: Franchot Gallo M.D.   On: 01/18/2021 18:48     EKG EKG Interpretation  Date/Time:  Tuesday January 18 2021 17:23:00 EST Ventricular Rate:  48 PR Interval:  154 QRS Duration: 92 QT Interval:  458 QTC Calculation: 409 R Axis:   46 Text Interpretation: Sinus bradycardia Nonspecific ST abnormality No previous tracing Confirmed by Lajean Saver [phone]) on 01/18/2021 5:38:37 PM  Radiology No results found.  Procedures Procedures   Medications Ordered in ED Medications - No data to display  ED Course  I have reviewed the triage vital signs and the nursing notes.  Pertinent labs & imaging results that were available during my care of the patient were reviewed by me and considered in my medical decision making (see chart for details).    MDM Rules/Calculators/A&P                         Pt sent to ED by pcp  to get mri/r/o cva - will order.   Reviewed nursing notes and prior charts for additional history.   On my exam, no facial weakness noted. No focal neuro deficit.   MRI reviewed/interpreted by me - no cva.  Labs reviewed/interpreted by me - chem normal.   No weakness or facial droop noted currently.   Pt appears stable for d/c.        Final Clinical Impression(s) / ED Diagnoses Final diagnoses:  None    Rx / DC Orders ED Discharge Orders     None        Lajean Saver, MD 01/18/21 [id number]

## 2021-01-18 NOTE — ED Provider Notes (Deleted)
Emergency Medicine Provider Triage Evaluation Note  Miguel Morrow , a 70 y.o. male  was evaluated in triage.  Pt complains of right facial droop.  Also some tingling under the right eye.  Started acutely yesterday, been constant.  No other symptoms, patient told to go to ED by doctor..  Review of Systems  Positive: Facial droop  Negative:   Physical Exam  BP (!) 190/72 (BP Location: Right Arm)    Pulse (!) 50    Temp 97.6 F (36.4 C) (Oral)    Resp 16    SpO2 98%  Gen:   Awake, no distress   Resp:  Normal effort  MSK:   Moves extremities without difficulty  Other:  Slight right-sided droop.  Grip strength is equal bilat, no pronator drift.  Medical Decision Making  Medically screening exam initiated at 5:31 PM.  Appropriate orders placed.  [name] was informed that the remainder of the evaluation will be completed by another provider, this initial triage assessment does not replace that evaluation, and the importance of remaining in the ED until their evaluation is complete.  Not code stroke    Sherrill Raring, [place] 01/18/21 1733

## 2021-01-18 NOTE — Discharge Instructions (Addendum)
It was our pleasure to provide your ER care today - we hope that you feel better.  Your labs and imaging looks good - no stroke.   Continue to take an aspirin a day.  Follow up with primary care doctor in the next 1-2 weeks - also have blood pressure rechecked then, as it is high today.  Return to ER if worse, new symptoms, one-sided numbness/weakness, change in speech or vision, or other concern.

## 2021-01-18 NOTE — ED Triage Notes (Signed)
Facial droop onset yesterday morning

## 2021-01-18 NOTE — ED Notes (Signed)
Patient transported to MRI 

## 2021-02-10 DIAGNOSIS — I10 Essential (primary) hypertension: Secondary | ICD-10-CM | POA: Diagnosis not present

## 2021-02-10 DIAGNOSIS — G8190 Hemiplegia, unspecified affecting unspecified side: Secondary | ICD-10-CM | POA: Diagnosis not present

## 2021-02-24 DIAGNOSIS — I10 Essential (primary) hypertension: Secondary | ICD-10-CM | POA: Diagnosis not present

## 2021-03-16 ENCOUNTER — Encounter: Payer: Self-pay | Admitting: *Deleted

## 2021-04-04 ENCOUNTER — Encounter: Payer: Self-pay | Admitting: *Deleted

## 2021-06-02 ENCOUNTER — Ambulatory Visit (INDEPENDENT_AMBULATORY_CARE_PROVIDER_SITE_OTHER): Payer: Self-pay | Admitting: *Deleted

## 2021-06-02 VITALS — Ht 66.0 in | Wt 172.0 lb

## 2021-06-02 DIAGNOSIS — Z86010 Personal history of colonic polyps: Secondary | ICD-10-CM

## 2021-06-02 NOTE — Progress Notes (Addendum)
Gastroenterology Pre-Procedure Review ? ?Request Date: 06/02/2021 ?Requesting Physician: 3 year recall, Last TCS 03/13/2018 by Dr. Gala Romney, colonic adenomas, tubular adenoma, family history of colon cancer (aunt) ? ?PATIENT REVIEW QUESTIONS: The patient responded to the following health history questions as indicated:   ? ?1. Diabetes Melitis: no ?2. Joint replacements in the past 12 months: no ?3. Major health problems in the past 3 months: yes, high blood pressure, managed by PCP ?4. Has an artificial valve or MVP: no ?5. Has a defibrillator: no ?6. Has been advised in past to take antibiotics in advance of a procedure like teeth cleaning: no ?7. Family history of colon cancer: yes, aunt: age 18's or 51's  ?8. Alcohol Use: no ?9. Illicit drug Use: no ?10. History of sleep apnea: no  ?11. History of coronary artery or other vascular stents placed within the last 12 months: no ?12. History of any prior anesthesia complications: no ?13. Body mass index is 27.76 kg/m?. ?   ?MEDICATIONS & ALLERGIES:    ?Patient reports the following regarding taking any blood thinners:   ?Plavix? no ?Aspirin? Yes, 81 mg daily ?Coumadin? no ?Brilinta? no ?Xarelto? no ?Eliquis? no ?Pradaxa? no ?Savaysa? no ?Effient? no ? ?Patient confirms/reports the following medications:  ?Current Outpatient Medications  ?Medication Sig Dispense Refill  ? acetaminophen (TYLENOL) 500 MG tablet Take 500 mg by mouth as needed.    ? aspirin 81 MG tablet Take 81 mg by mouth daily.    ? gabapentin (NEURONTIN) 100 MG capsule Take 100 mg by mouth as needed.    ? Multiple Vitamin (MULTIVITAMIN) tablet Take 1 tablet by mouth daily.    ? olmesartan (BENICAR) 40 MG tablet Take 40 mg by mouth daily.    ? Probiotic Product (PROBIOTIC DAILY PO) Take by mouth daily at 6 (six) AM.    ? ?No current facility-administered medications for this visit.  ? ? ?Patient confirms/reports the following allergies:  ?Allergies  ?Allergen Reactions  ? Statins   ?  joint pains.  ? ? ?No  orders of the defined types were placed in this encounter. ? ? ?AUTHORIZATION INFORMATION ?Primary Insurance: [place],  [place] #: [id number],  Group #: [id number] ?Pre-Cert / Josem Kaufmann required: No, not required ? ?SCHEDULE INFORMATION: ?Procedure has been scheduled as follows:  ?Date: 06/10/2021, Time:  10:00 ?Location: [facility] with Dr. Gala Romney ? ?This Gastroenterology Pre-Precedure Review Form is being routed to the following provider(s): Aliene Altes, PA-C ?  ?

## 2021-06-02 NOTE — Progress Notes (Signed)
OK to schedule. ASA 2.  ?

## 2021-06-03 ENCOUNTER — Encounter: Payer: Self-pay | Admitting: *Deleted

## 2021-06-03 MED ORDER — PEG 3350-KCL-NA BICARB-NACL 420 G PO SOLR: 4000.0000 mL | Freq: Once | ORAL | 0 refills | Status: AC

## 2021-06-03 NOTE — Progress Notes (Signed)
Spoke to pt.  Scheduled procedure for 06/10/2021 at 10:00, arrival 8:30 at [facility].  Reviewed prep instructions with pt by phone.  Pt made aware to pick up prep kit along with OTC required items (enema and dulcolax).  Confirmed mailing address.  Mailed prep instructions and faxed copy to pharmacy.  Pt made aware.  He was also informed that he can come by office to get a copy if he did not receive them in a timely manner. ?

## 2021-06-03 NOTE — Addendum Note (Signed)
Addended by: Metro Kung on: 06/03/2021 09:19 AM ? ? Modules accepted: Orders ? ?

## 2021-06-06 DIAGNOSIS — H43393 Other vitreous opacities, bilateral: Secondary | ICD-10-CM | POA: Diagnosis not present

## 2021-06-09 ENCOUNTER — Telehealth: Payer: Self-pay

## 2021-06-09 NOTE — Telephone Encounter (Signed)
Called pt, TCS for tomorrow moved up to 9:15am. Advised him to arrive at 7:45am. Start drinking 2nd half of prep tomorrow at 4:15am. NPO after 6:15am. Melanie at [facility] informed. ?

## 2021-06-10 ENCOUNTER — Encounter (HOSPITAL_COMMUNITY): Payer: Self-pay | Admitting: Internal Medicine

## 2021-06-10 ENCOUNTER — Other Ambulatory Visit: Payer: Self-pay

## 2021-06-10 ENCOUNTER — Ambulatory Visit (HOSPITAL_COMMUNITY): Payer: Medicare HMO | Admitting: Anesthesiology

## 2021-06-10 ENCOUNTER — Ambulatory Visit (HOSPITAL_COMMUNITY)
Admission: RE | Admit: 2021-06-10 | Discharge: 2021-06-10 | Disposition: A | Discharge status: Home / Self Care | Payer: Medicare HMO | Attending: Internal Medicine | Admitting: Internal Medicine

## 2021-06-10 ENCOUNTER — Ambulatory Visit (HOSPITAL_BASED_OUTPATIENT_CLINIC_OR_DEPARTMENT_OTHER): Payer: Medicare HMO | Admitting: Anesthesiology

## 2021-06-10 ENCOUNTER — Encounter (HOSPITAL_COMMUNITY)
Admission: RE | Disposition: A | Discharge status: Home / Self Care | Payer: Self-pay | Source: Home / Self Care | Attending: Internal Medicine

## 2021-06-10 DIAGNOSIS — D124 Benign neoplasm of descending colon: Secondary | ICD-10-CM | POA: Diagnosis not present

## 2021-06-10 DIAGNOSIS — Z86010 Personal history of colonic polyps: Secondary | ICD-10-CM

## 2021-06-10 DIAGNOSIS — K5730 Diverticulosis of large intestine without perforation or abscess without bleeding: Secondary | ICD-10-CM

## 2021-06-10 DIAGNOSIS — D125 Benign neoplasm of sigmoid colon: Secondary | ICD-10-CM | POA: Diagnosis not present

## 2021-06-10 DIAGNOSIS — K635 Polyp of colon: Secondary | ICD-10-CM | POA: Diagnosis not present

## 2021-06-10 DIAGNOSIS — I10 Essential (primary) hypertension: Secondary | ICD-10-CM | POA: Insufficient documentation

## 2021-06-10 DIAGNOSIS — Z79899 Other long term (current) drug therapy: Secondary | ICD-10-CM | POA: Diagnosis not present

## 2021-06-10 DIAGNOSIS — Z1211 Encounter for screening for malignant neoplasm of colon: Secondary | ICD-10-CM | POA: Insufficient documentation

## 2021-06-10 DIAGNOSIS — Z87891 Personal history of nicotine dependence: Secondary | ICD-10-CM | POA: Diagnosis not present

## 2021-06-10 DIAGNOSIS — K6389 Other specified diseases of intestine: Secondary | ICD-10-CM | POA: Diagnosis not present

## 2021-06-10 HISTORY — DX: Essential (primary) hypertension: I10

## 2021-06-10 HISTORY — DX: Other seasonal allergic rhinitis: J30.2

## 2021-06-10 HISTORY — PX: COLONOSCOPY WITH PROPOFOL: SHX5780

## 2021-06-10 HISTORY — PX: POLYPECTOMY: SHX5525

## 2021-06-10 SURGERY — COLONOSCOPY WITH PROPOFOL
Anesthesia: General

## 2021-06-10 MED ORDER — PROPOFOL 10 MG/ML IV BOLUS
INTRAVENOUS | Status: DC | PRN
  Administered 2021-06-10: 100 mg via INTRAVENOUS
  Administered 2021-06-10: 20 mg via INTRAVENOUS

## 2021-06-10 MED ORDER — PROPOFOL 500 MG/50ML IV EMUL
INTRAVENOUS | Status: DC | PRN
  Administered 2021-06-10: 150 ug/kg/min via INTRAVENOUS

## 2021-06-10 MED ORDER — LIDOCAINE HCL (CARDIAC) PF 100 MG/5ML IV SOSY
PREFILLED_SYRINGE | INTRAVENOUS | Status: DC | PRN
  Administered 2021-06-10: 50 mg via INTRAVENOUS

## 2021-06-10 MED ORDER — LACTATED RINGERS IV SOLN
INTRAVENOUS | Status: DC | PRN
Start: 2021-06-10 — End: 2021-06-10

## 2021-06-10 NOTE — H&P (Signed)
@LOGO @ ? ? ?Primary Care Physician:  Celene Squibb, MD ?Primary Gastroenterologist:  Dr. Gala Romney ? ?Pre-Procedure History & Physical: ?HPI:  Miguel Morrow is a 71 y.o. male here for surveillance colonoscopy.  He has a history of multiple colonic adenomas removed over serial colonoscopies.  6 removed 2020.  No bowel symptoms at this time.  He is here for surveillance colonoscopy. ? ?Past Medical History:  ?Diagnosis Date  ? Bradycardia   ? Hypertension   ? Nephrolithiasis 01/30/1982  ? Seasonal allergies   ? ? ?Past Surgical History:  ?Procedure Laterality Date  ? COLONOSCOPY N/A 09/26/2012  ? Procedure: COLONOSCOPY;  Surgeon: Daneil Dolin, MD;  Location: [facility];  Service: Endoscopy;  Laterality: N/A;  1:45  ? COLONOSCOPY N/A 03/13/2018  ? Dr. Gala Romney: 6 polyps removed, tubular adenomas.  Diverticulosis.  Next colonoscopy 3 years.  ? Kidney stone basket extraction  1984  ? POLYPECTOMY  03/13/2018  ? Procedure: POLYPECTOMY;  Surgeon: Daneil Dolin, MD;  Location: [facility];  Service: Endoscopy;;  colon  ? ? ?Prior to Admission medications   ?Medication Sig Start Date End Date Taking? Authorizing Provider  ?acetaminophen (TYLENOL) 500 MG tablet Take 500 mg by mouth every 8 (eight) hours as needed for mild pain or moderate pain.   Yes [provider]  ?aspirin 81 MG tablet Take 81 mg by mouth daily.   Yes [provider]  ?gabapentin (NEURONTIN) 100 MG capsule Take 100 mg by mouth as needed (Nerve pain).   Yes [provider]  ?loratadine (CLARITIN) 10 MG tablet Take 10 mg by mouth daily.   Yes [provider]  ?Multiple Vitamin (MULTIVITAMIN) tablet Take 1 tablet by mouth daily. Centrum gummy   Yes [provider]  ?olmesartan (BENICAR) 40 MG tablet Take 40 mg by mouth daily.   Yes [provider]  ?Polyethyl Glycol-Propyl Glycol (SYSTANE OP) Place 1 drop into both eyes daily as needed (Dry eye).   Yes [provider]  ?Probiotic Product (PROBIOTIC  DAILY PO) Take 1 tablet by mouth daily at 6 (six) AM. Waunita Schooner [provider]  ? ? ?Allergies as of 06/03/2021 - Review Complete 06/02/2021  ?Allergen Reaction Noted  ? Statins  12/22/2019  ? ? ?Family History  ?Problem Relation Age of Onset  ? Heart disease Father   ?     Died age 63  ? Colon cancer Paternal Aunt   ?     Greater than age of 59  ? Heart disease Paternal Uncle   ? Liver disease Neg Hx   ? ? ?Social History  ? ?Socioeconomic History  ? Marital status: Married  ?  Spouse name: Not on file  ? Number of children: 2  ? Years of education: Not on file  ? Highest education level: Not on file  ?Occupational History  ? Occupation: Advertising copywriter  ?Tobacco Use  ? Smoking status: Former  ?  Types: Cigarettes  ?  Quit date: 01/31/2008  ?  Years since quitting: 13.3  ? Smokeless tobacco: Former  ?  Quit date: 05/17/2008  ?Vaping Use  ? Vaping Use: Never used  ?Substance and Sexual Activity  ? Alcohol use: No  ?  Alcohol/week: 0.0 standard drinks  ? Drug use: No  ? Sexual activity: Yes  ?Other Topics Concern  ? Not on file  ?Social History Narrative  ? Not on file  ? ?Social Determinants of Health  ? ?Emergency planning/management officer  Strain: Not on file  ?Food Insecurity: Not on file  ?Transportation Needs: Not on file  ?Physical Activity: Not on file  ?Stress: Not on file  ?Social Connections: Not on file  ?Intimate Partner Violence: Not on file  ? ? ?Review of Systems: ?See HPI, otherwise negative ROS ? ?Physical Exam: ?BP (!) 175/76   Pulse (!) 53   Temp 97.7 ?F (36.5 ?C) (Oral)   Resp 18   Ht 5' 6"  (1.676 m)   Wt 78 kg   SpO2 99%   BMI 27.76 kg/m?  ?General:   Alert,  Well-developed, well-nourished, pleasant and cooperative in NAD ?Neck:  Supple; no masses or thyromegaly. No significant cervical adenopathy. ?Lungs:  Clear throughout to auscultation.   No wheezes, crackles, or rhonchi. No acute distress. ?Heart:  Regular rate and rhythm; no murmurs, clicks, rubs,  or gallops. ?Abdomen: Non-distended, normal  bowel sounds.  Soft and nontender without appreciable mass or hepatosplenomegaly.  ?Pulses:  Normal pulses noted. ?Extremities:  Without clubbing or edema. ? ?Impression/Plan: 70 year old gentleman multiple colonic adenomas removed over time.  He is here for surveillance colonoscopy. ?The risks, benefits, limitations, alternatives and imponderables have been reviewed with the patient. Questions have been answered. All parties are agreeable.   ? ? ? ? ?Notice: This dictation was prepared with Dragon dictation along with smaller phrase technology. Any transcriptional errors that result from this process are unintentional and may not be corrected upon review.  ? ?

## 2021-06-10 NOTE — Anesthesia Procedure Notes (Signed)
Date/Time: 06/10/2021 9:47 AM ?Performed by: Karna Dupes, CRNA ?Pre-anesthesia Checklist: Patient identified, Emergency Drugs available, Suction available and Patient being monitored ?Oxygen Delivery Method: Nasal cannula ? ? ? ? ?

## 2021-06-10 NOTE — Transfer of Care (Signed)
Immediate Anesthesia Transfer of Care Note ? ?Patient: Miguel Morrow ? ?Procedure(s) Performed: COLONOSCOPY WITH PROPOFOL ?POLYPECTOMY ? ?Patient Location: Endoscopy Unit ? ?Anesthesia Type:General ? ?Level of Consciousness: drowsy ? ?Airway & Oxygen Therapy: Patient Spontanous Breathing ? ?Post-op Assessment: Report given to RN and Post -op Vital signs reviewed and stable ? ?Post vital signs: Reviewed and stable ? ?Last Vitals:  ?Vitals Value Taken Time  ?BP 99/50   ?Temp    ?Pulse 47   ?Resp 13   ?SpO2 96%   ? ? ?Last Pain:  ?Vitals:  ? 06/10/21 0944  ?TempSrc:   ?PainSc: 0-No pain  ?   ? ?Patients Stated Pain Goal: 6 (06/10/21 0755) ? ?Complications: No notable events documented. ?

## 2021-06-10 NOTE — Anesthesia Postprocedure Evaluation (Signed)
Anesthesia Post Note ? ?Patient: Miguel Morrow ? ?Procedure(s) Performed: COLONOSCOPY WITH PROPOFOL ?POLYPECTOMY ? ?Patient location during evaluation: Phase II ?Anesthesia Type: General ?Level of consciousness: awake ?Pain management: pain level controlled ?Vital Signs Assessment: post-procedure vital signs reviewed and stable ?Respiratory status: spontaneous breathing and respiratory function stable ?Cardiovascular status: blood pressure returned to baseline and stable ?Postop Assessment: no headache and no apparent nausea or vomiting ?Anesthetic complications: no ?Comments: Late entry ? ? ?No notable events documented. ? ? ?Last Vitals:  ?Vitals:  ? 06/10/21 1010 06/10/21 1012  ?BP: (!) 99/52 (!) 112/59  ?Pulse:    ?Resp: 16 14  ?Temp:    ?SpO2: 95% 96%  ?  ?Last Pain:  ?Vitals:  ? 06/10/21 1010  ?TempSrc:   ?PainSc: 0-No pain  ? ? ?  ?  ?  ?  ?  ?  ? ?Louann Sjogren ? ? ? ? ?

## 2021-06-10 NOTE — Op Note (Signed)
[facility] ?Patient Name: Miguel Morrow ?Procedure Date: 06/10/2021 9:13 AM ?MRN: [id number] ?Date of Birth: 01-03-1951 ?Attending MD: Gennette Pac , MD ?CSN: [id number] ?Age: 71 ?Admit Type: Outpatient ?Procedure:                Colonoscopy ?Indications:              High risk colon cancer surveillance: Personal  ?                          history of colonic polyps ?Providers:                Gennette Pac, MD, Sheran Fava, Vonna Kotyk  ?                          Durene Romans, Technician ?Referring MD:              ?Medicines:                Propofol per Anesthesia ?Complications:            No immediate complications. ?Estimated Blood Loss:     Estimated blood loss was minimal. ?Procedure:                Pre-Anesthesia Assessment: ?                          - Prior to the procedure, a History and Physical  ?                          was performed, and patient medications and  ?                          allergies were reviewed. The patient's tolerance of  ?                          previous anesthesia was also reviewed. The risks  ?                          and benefits of the procedure and the sedation  ?                          options and risks were discussed with the patient.  ?                          All questions were answered, and informed consent  ?                          was obtained. Prior Anticoagulants: The patient has  ?                          taken no previous anticoagulant or antiplatelet  ?                          agents. ASA Grade Assessment: II - A patient with  ?  mild systemic disease. After reviewing the risks  ?                          and benefits, the patient was deemed in  ?                          satisfactory condition to undergo the procedure. ?                          After obtaining informed consent, the colonoscope  ?                          was passed under direct vision. Throughout the  ?                          procedure, the  patient's blood pressure, pulse, and  ?                          oxygen saturations were monitored continuously. The  ?                          [id number]) scope was introduced through the  ?                          anus and advanced to the the cecum, identified by  ?                          appendiceal orifice and ileocecal valve. The  ?                          colonoscopy was performed without difficulty. The  ?                          patient tolerated the procedure well. The quality  ?                          of the bowel preparation was adequate. The entire  ?                          colon was well visualized. The ileocecal valve,  ?                          appendiceal orifice, and rectum were photographed. ?Scope In: 9:46:07 AM ?Scope Out: 10:01:36 AM ?Scope Withdrawal Time: 0 hours 12 minutes 13 seconds  ?Total Procedure Duration: 0 hours 15 minutes 29 seconds  ?Findings: ?     The perianal and digital rectal examinations were normal. ?     Scattered medium-mouthed diverticula were found in the sigmoid colon and  ?     descending colon. ?     Three sessile polyps were found in the sigmoid colon, descending colon  ?     and cecum. The polyps were 3 to 5 mm in size. These polyps were removed  ?     with a cold snare. Resection and retrieval were complete. Estimated  ?  blood loss was minimal. ?     The exam was otherwise without abnormality on direct and retroflexion  ?     views. ?Impression:               - Diverticulosis in the sigmoid colon and in the  ?                          descending colon. ?                          - Three 3 to 5 mm polyps in the sigmoid colon, in  ?                          the descending colon and in the cecum, removed with  ?                          a cold snare. Resected and retrieved. ?                          - The examination was otherwise normal on direct  ?                          and retroflexion views. ?Moderate Sedation: ?     Moderate (conscious)  sedation was personally administered by an  ?     anesthesia professional. The following parameters were monitored: oxygen  ?     saturation, heart rate, blood pressure, respiratory rate, EKG, adequacy  ?     of pulmonary ventilation, and response to care. ?Recommendation:           - Patient has a contact number available for  ?                          emergencies. The signs and symptoms of potential  ?                          delayed complications were discussed with the  ?                          patient. Return to normal activities tomorrow.  ?                          Written discharge instructions were provided to the  ?                          patient. ?                          - Advance diet as tolerated. ?                          - Continue present medications. ?                          - Repeat colonoscopy date to be determined after  ?  pending pathology results are reviewed for  ?                          surveillance. ?                          - Return to GI office after studies are complete. ?Procedure Code(s):        --- Professional --- ?                          [id number], Colonoscopy, flexible; with removal of  ?                          tumor(s), polyp(s), or other lesion(s) by snare  ?                          technique ?Diagnosis Code(s):        --- Professional --- ?                          Z86.010, Personal history of colonic polyps ?                          K63.5, Polyp of colon ?                          K57.30, Diverticulosis of large intestine without  ?                          perforation or abscess without bleeding ?CPT copyright 2019 American Medical Association. All rights reserved. ?The codes documented in this report are preliminary and upon coder review may  ?be revised to meet current compliance requirements. ?Gerrit Friends. Daveah Varone, MD ?Gennette Pac, MD ?06/10/2021 10:08:43 AM ?This report has been signed electronically. ?Number of Addenda: 0 ?

## 2021-06-10 NOTE — Anesthesia Preprocedure Evaluation (Signed)
Anesthesia Evaluation  ?Patient identified by MRN, date of birth, ID band ?Patient awake ? ? ? ?Reviewed: ?Allergy & Precautions, H&P , NPO status , Patient's Chart, lab work & pertinent test results, reviewed documented beta blocker date and time  ? ?Airway ?Mallampati: II ? ?TM Distance: >3 FB ?Neck ROM: full ? ? ? Dental ?no notable dental hx. ? ?  ?Pulmonary ?neg pulmonary ROS, former smoker,  ?  ?Pulmonary exam normal ?breath sounds clear to auscultation ? ? ? ? ? ? Cardiovascular ?Exercise Tolerance: Good ?hypertension, negative cardio ROS ? ? ?Rhythm:regular Rate:Normal ? ? ?  ?Neuro/Psych ? Neuromuscular disease negative psych ROS  ? GI/Hepatic ?Neg liver ROS, PUD,   ?Endo/Other  ?negative endocrine ROS ? Renal/GU ?negative Renal ROS  ?negative genitourinary ?  ?Musculoskeletal ? ? Abdominal ?  ?Peds ? Hematology ?negative hematology ROS ?(+)   ?Anesthesia Other Findings ? ? Reproductive/Obstetrics ?negative OB ROS ? ?  ? ? ? ? ? ? ? ? ? ? ? ? ? ?  ?  ? ? ? ? ? ? ? ? ?Anesthesia Physical ?Anesthesia Plan ? ?ASA: 2 ? ?Anesthesia Plan: General  ? ?Post-op Pain Management:   ? ?Induction:  ? ?PONV Risk Score and Plan: Propofol infusion ? ?Airway Management Planned:  ? ?Additional Equipment:  ? ?Intra-op Plan:  ? ?Post-operative Plan:  ? ?Informed Consent: I have reviewed the patients History and Physical, chart, labs and discussed the procedure including the risks, benefits and alternatives for the proposed anesthesia with the patient or authorized representative who has indicated his/her understanding and acceptance.  ? ? ? ?Dental Advisory Given ? ?Plan Discussed with: CRNA ? ?Anesthesia Plan Comments:   ? ? ? ? ? ? ?Anesthesia Quick Evaluation ? ?

## 2021-06-10 NOTE — Discharge Instructions (Signed)
?  Colonoscopy ?Discharge Instructions ? ?Read the instructions outlined below and refer to this sheet in the next few weeks. These discharge instructions provide you with general information on caring for yourself after you leave the hospital. Your doctor may also give you specific instructions. While your treatment has been planned according to the most current medical practices available, unavoidable complications occasionally occur. If you have any problems or questions after discharge, call Dr. Gala Romney at [phone]. ?ACTIVITY ?You may resume your regular activity, but move at a slower pace for the next 24 hours.  ?Take frequent rest periods for the next 24 hours.  ?Walking will help get rid of the air and reduce the bloated feeling in your belly (abdomen).  ?No driving for 24 hours (because of the medicine (anesthesia) used during the test).   ?Do not sign any important legal documents or operate any machinery for 24 hours (because of the anesthesia used during the test).  ?NUTRITION ?Drink plenty of fluids.  ?You may resume your normal diet as instructed by your doctor.  ?Begin with a light meal and progress to your normal diet. Heavy or fried foods are harder to digest and may make you feel sick to your stomach (nauseated).  ?Avoid alcoholic beverages for 24 hours or as instructed.  ?MEDICATIONS ?You may resume your normal medications unless your doctor tells you otherwise.  ?WHAT YOU CAN EXPECT TODAY ?Some feelings of bloating in the abdomen.  ?Passage of more gas than usual.  ?Spotting of blood in your stool or on the toilet paper.  ?IF YOU HAD POLYPS REMOVED DURING THE COLONOSCOPY: ?No aspirin products for 7 days or as instructed.  ?No alcohol for 7 days or as instructed.  ?Eat a soft diet for the next 24 hours.  ?FINDING OUT THE RESULTS OF YOUR TEST ?Not all test results are available during your visit. If your test results are not back during the visit, make an appointment with your caregiver to find out the  results. Do not assume everything is normal if you have not heard from your caregiver or the medical facility. It is important for you to follow up on all of your test results.  ?SEEK IMMEDIATE MEDICAL ATTENTION IF: ?You have more than a spotting of blood in your stool.  ?Your belly is swollen (abdominal distention).  ?You are nauseated or vomiting.  ?You have a temperature over 101.  ?You have abdominal pain or discomfort that is severe or gets worse throughout the day.   ? ? ?3 polyps removed from your colon. ? ?Colon polyp and diverticulosis information provided ? ?Further recommendations to follow pending review of pathology report ? ?Patient request I called [name] Blick at [phone]  -reviewed findings and recommendations ?

## 2021-06-14 ENCOUNTER — Encounter: Payer: Self-pay | Admitting: Internal Medicine

## 2021-06-14 LAB — SURGICAL PATHOLOGY

## 2021-06-17 ENCOUNTER — Encounter (HOSPITAL_COMMUNITY): Payer: Self-pay | Admitting: Internal Medicine

## 2021-07-20 ENCOUNTER — Telehealth: Payer: Self-pay | Admitting: Internal Medicine

## 2021-07-20 NOTE — Telephone Encounter (Signed)
Pt called to say he had a colonoscopy by Dr Gala Romney on 06/10/2021 and hasn't heard anything or received a letter regarding his results. I verified his address and phone number. I told him that I would mail him another result letter and he would be due another colonoscopy in 7 years.I told him that I would have the nurse call him also. [phone]

## 2021-07-21 NOTE — Telephone Encounter (Signed)
Pt was made aware and verbalized understanding.

## 2021-08-06 DIAGNOSIS — J0100 Acute maxillary sinusitis, unspecified: Secondary | ICD-10-CM | POA: Diagnosis not present

## 2021-08-06 DIAGNOSIS — R0981 Nasal congestion: Secondary | ICD-10-CM | POA: Diagnosis not present

## 2021-08-17 DIAGNOSIS — H35033 Hypertensive retinopathy, bilateral: Secondary | ICD-10-CM | POA: Diagnosis not present

## 2021-08-17 DIAGNOSIS — H26493 Other secondary cataract, bilateral: Secondary | ICD-10-CM | POA: Diagnosis not present

## 2021-08-17 DIAGNOSIS — H179 Unspecified corneal scar and opacity: Secondary | ICD-10-CM | POA: Diagnosis not present

## 2021-08-17 DIAGNOSIS — H26491 Other secondary cataract, right eye: Secondary | ICD-10-CM | POA: Diagnosis not present

## 2021-08-17 DIAGNOSIS — H43393 Other vitreous opacities, bilateral: Secondary | ICD-10-CM | POA: Diagnosis not present

## 2021-11-02 DIAGNOSIS — Z13220 Encounter for screening for lipoid disorders: Secondary | ICD-10-CM | POA: Diagnosis not present

## 2021-11-02 DIAGNOSIS — Z0001 Encounter for general adult medical examination with abnormal findings: Secondary | ICD-10-CM | POA: Diagnosis not present

## 2021-11-09 DIAGNOSIS — Z23 Encounter for immunization: Secondary | ICD-10-CM | POA: Diagnosis not present

## 2021-11-09 DIAGNOSIS — Z0001 Encounter for general adult medical examination with abnormal findings: Secondary | ICD-10-CM | POA: Diagnosis not present

## 2021-11-09 DIAGNOSIS — R001 Bradycardia, unspecified: Secondary | ICD-10-CM | POA: Diagnosis not present

## 2021-11-09 DIAGNOSIS — E782 Mixed hyperlipidemia: Secondary | ICD-10-CM | POA: Diagnosis not present

## 2021-11-09 DIAGNOSIS — I10 Essential (primary) hypertension: Secondary | ICD-10-CM | POA: Diagnosis not present

## 2021-11-09 DIAGNOSIS — Z125 Encounter for screening for malignant neoplasm of prostate: Secondary | ICD-10-CM | POA: Diagnosis not present

## 2021-11-09 DIAGNOSIS — Z0000 Encounter for general adult medical examination without abnormal findings: Secondary | ICD-10-CM | POA: Diagnosis not present

## 2022-02-15 DIAGNOSIS — H35463 Secondary vitreoretinal degeneration, bilateral: Secondary | ICD-10-CM | POA: Diagnosis not present

## 2022-03-09 DIAGNOSIS — H43813 Vitreous degeneration, bilateral: Secondary | ICD-10-CM | POA: Diagnosis not present

## 2022-08-15 DIAGNOSIS — Z87891 Personal history of nicotine dependence: Secondary | ICD-10-CM | POA: Diagnosis not present

## 2022-08-15 DIAGNOSIS — M792 Neuralgia and neuritis, unspecified: Secondary | ICD-10-CM | POA: Diagnosis not present

## 2022-08-15 DIAGNOSIS — I10 Essential (primary) hypertension: Secondary | ICD-10-CM | POA: Diagnosis not present

## 2022-08-15 DIAGNOSIS — Z008 Encounter for other general examination: Secondary | ICD-10-CM | POA: Diagnosis not present

## 2022-08-15 DIAGNOSIS — J309 Allergic rhinitis, unspecified: Secondary | ICD-10-CM | POA: Diagnosis not present

## 2022-08-15 DIAGNOSIS — Z87440 Personal history of urinary (tract) infections: Secondary | ICD-10-CM | POA: Diagnosis not present

## 2022-08-15 DIAGNOSIS — E785 Hyperlipidemia, unspecified: Secondary | ICD-10-CM | POA: Diagnosis not present

## 2022-08-15 DIAGNOSIS — M1990 Unspecified osteoarthritis, unspecified site: Secondary | ICD-10-CM | POA: Diagnosis not present

## 2022-08-15 DIAGNOSIS — N529 Male erectile dysfunction, unspecified: Secondary | ICD-10-CM | POA: Diagnosis not present

## 2022-08-15 DIAGNOSIS — R001 Bradycardia, unspecified: Secondary | ICD-10-CM | POA: Diagnosis not present

## 2022-08-15 DIAGNOSIS — Z8249 Family history of ischemic heart disease and other diseases of the circulatory system: Secondary | ICD-10-CM | POA: Diagnosis not present

## 2022-08-15 DIAGNOSIS — Z7982 Long term (current) use of aspirin: Secondary | ICD-10-CM | POA: Diagnosis not present

## 2022-08-31 DIAGNOSIS — Z961 Presence of intraocular lens: Secondary | ICD-10-CM | POA: Diagnosis not present

## 2022-09-04 IMAGING — MR MR HEAD W/O CM
11 of 12 series · 41 of 48 positions shown · non-contrast
Comparison: CT head 01/18/2021

CLINICAL DATA: Right facial droop.  Acute neuro deficit

EXAM:
MRI HEAD WITHOUT CONTRAST
TECHNIQUE: Multiplanar, multiecho pulse sequences of the brain and surrounding
structures were obtained without intravenous contrast.

[Series 5: DWI · axial · 4.0mm · 0.88mm/px · z∈[-61,+83]mm · 4 of 37 slices shown (1 of 6)]
[im 1/37]
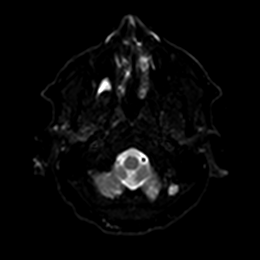
[im 13/37]
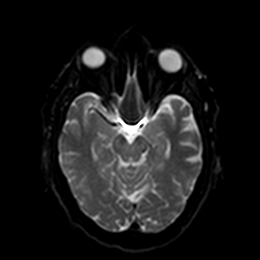
[im 25/37]
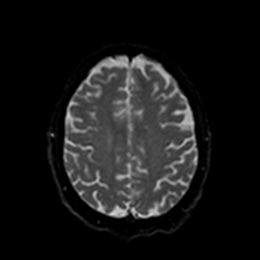
[im 37/37]
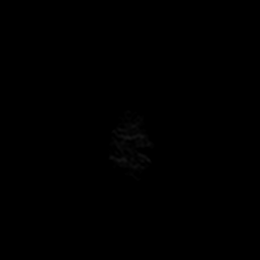

[Series 5: DWI · axial · 4.0mm · 0.88mm/px · z∈[-61,+83]mm · 4 of 37 slices shown (2 of 6)]
[im 1/37]
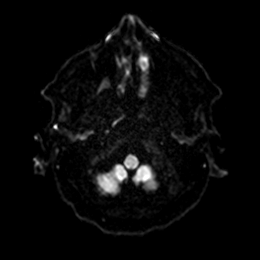
[im 13/37]
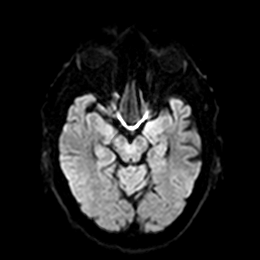
[im 25/37]
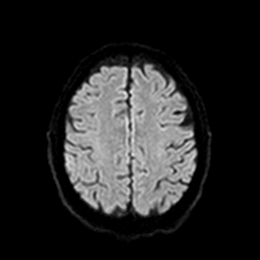
[im 37/37]
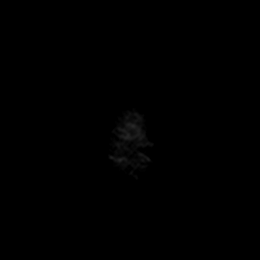

[Series 6: DWI · axial · 4.0mm · 0.88mm/px · z∈[-61,+83]mm · 4 of 37 slices shown (3 of 6)]
[im 1/37]
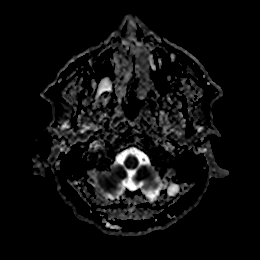
[im 13/37]
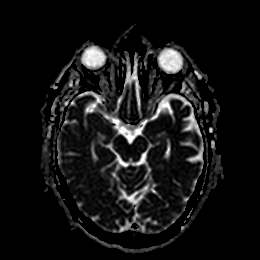
[im 25/37]
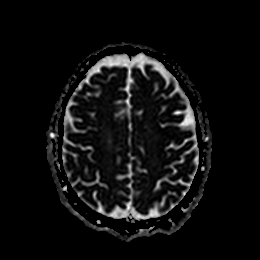
[im 37/37]
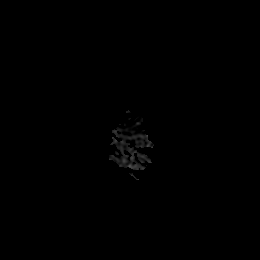

[Series 7: DWI · coronal · 5.0mm · 0.88mm/px · 3 of 28 slices shown (4 of 6)]
[im 1/28]
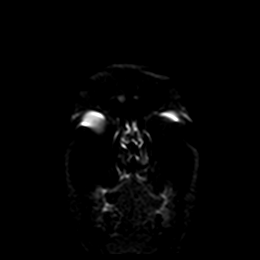
[im 14/28]
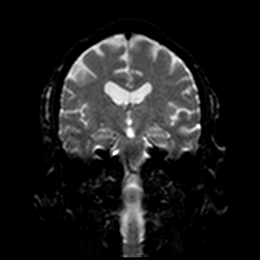
[im 28/28]
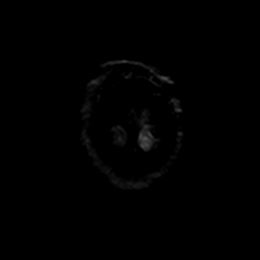

[Series 7: DWI · coronal · 5.0mm · 0.88mm/px · 4 of 28 slices shown (5 of 6)]
[im 1/28]
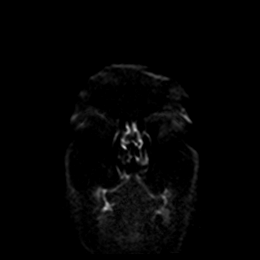
[im 10/28]
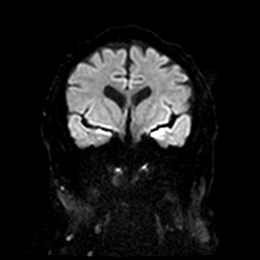
[im 19/28]
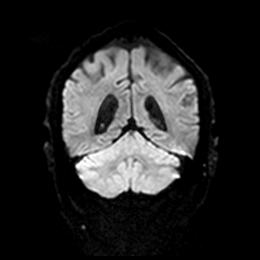
[im 28/28]
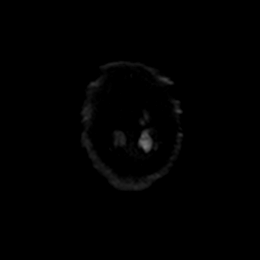

[Series 8: DWI · coronal · 5.0mm · 0.88mm/px · 4 of 28 slices shown (6 of 6)]
[im 1/28]
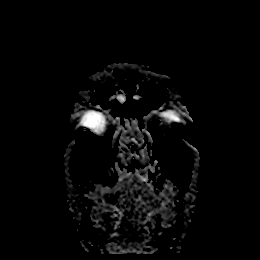
[im 10/28]
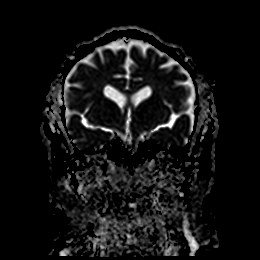
[im 19/28]
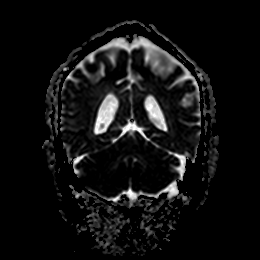
[im 28/28]
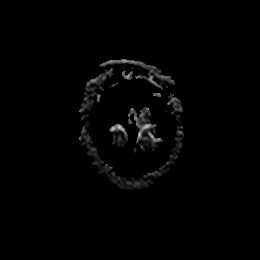

[Series 9: T1 · sagittal · 5.0mm · 0.94mm/px · 3 of 22 slices shown]
[im 1/22]
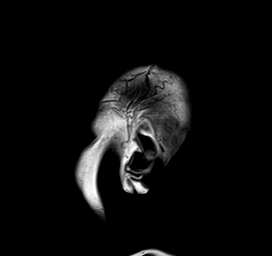
[im 11/22]
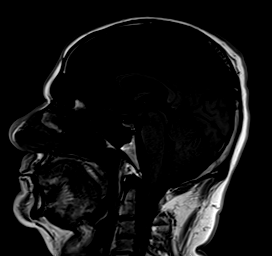
[im 22/22]
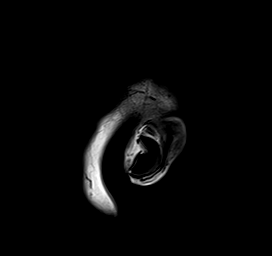

[Series 10: T2 · axial · 5.0mm · 0.72mm/px · z∈[-68,+79]mm · 3 of 22 slices shown (1 of 2)]
[im 1/22]
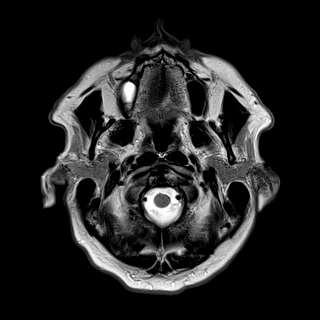
[im 11/22]
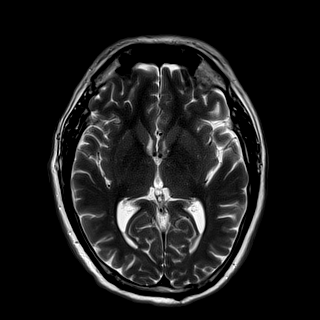
[im 22/22]
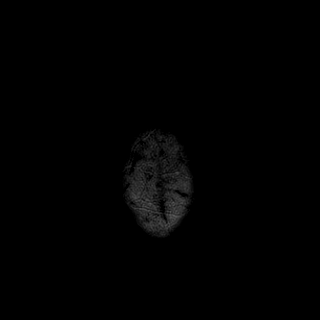

[Series 11: ax hemo · axial · 5.0mm · 0.86mm/px · z∈[-69,+81]mm · 3 of 26 slices shown]
[im 1/26]
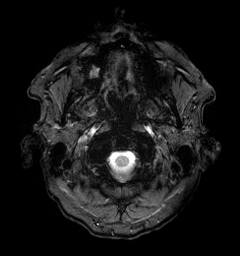
[im 13/26]
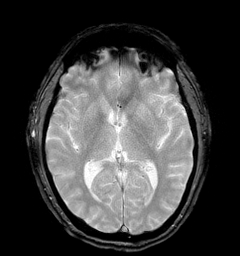
[im 26/26]
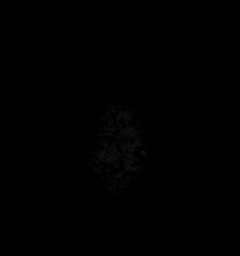

[Series 12: FLAIR · axial · 4.0mm · 0.43mm/px · z∈[-67,+80]mm · 5 of 38 slices shown]
[im 1/38]
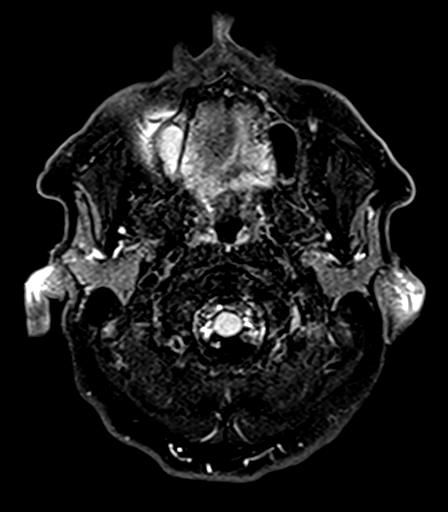
[im 10/38]
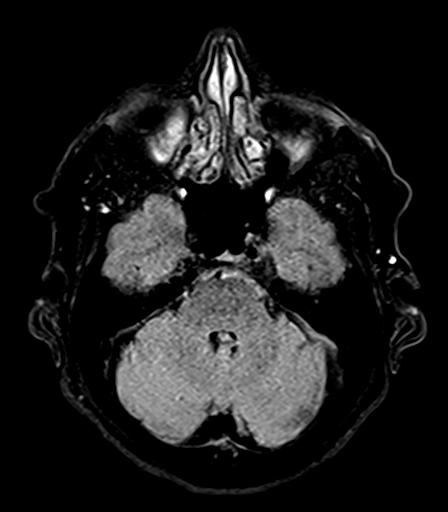
[im 19/38]
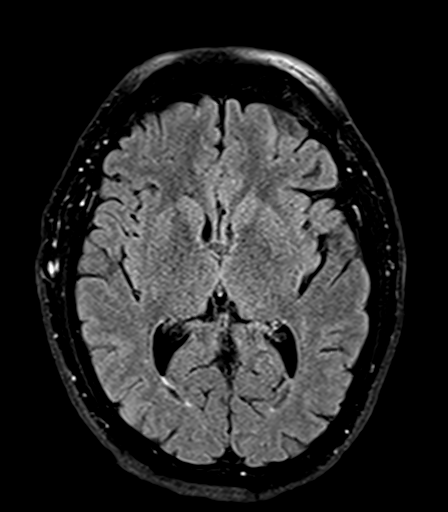
[im 28/38]
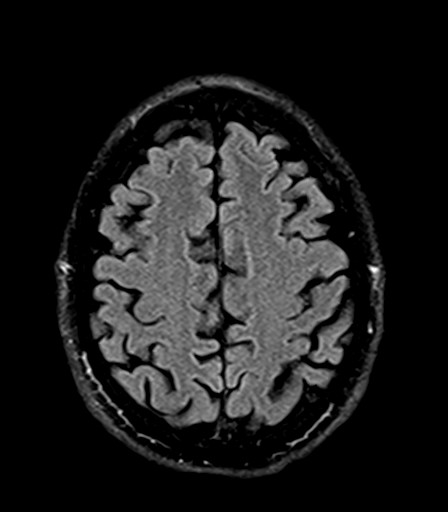
[im 38/38]
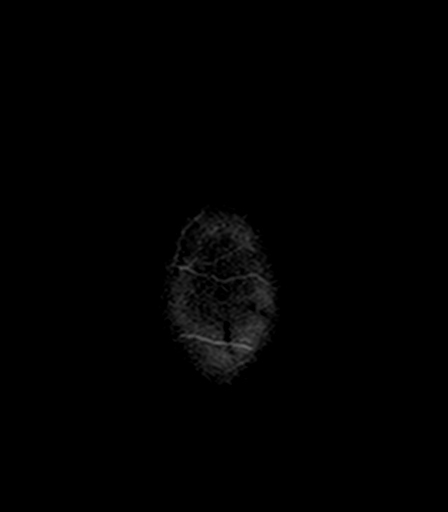

[Series 14: T2 · coronal · 5.0mm · 0.72mm/px · 4 of 28 slices shown (2 of 2)]
[im 1/28]
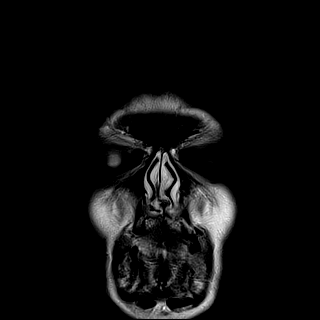
[im 10/28]
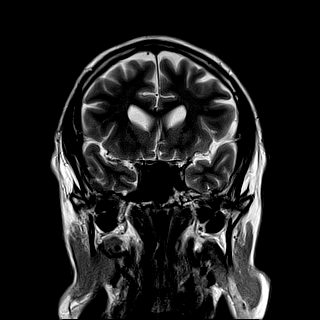
[im 19/28]
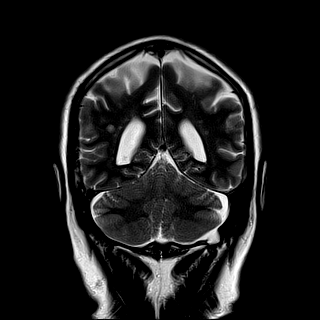
[im 28/28]
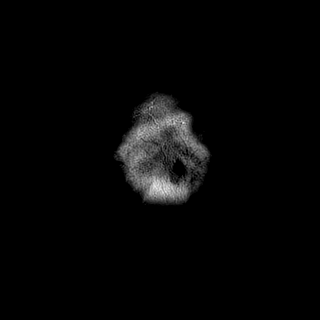

[41 of 48 positions shown; findings below may reference images not displayed]

FINDINGS: Brain: No acute infarction, hemorrhage, hydrocephalus, extra-axial
collection or mass lesion. Mild white matter changes with small
white matter hyperintensities bilaterally. Brainstem normal.

Vascular: Normal arterial flow voids

Skull and upper cervical spine: No focal skeletal abnormality.

Sinuses/Orbits: Retention cyst right maxillary sinus. Remaining
sinuses clear. Bilateral cataract extraction

Other: None
IMPRESSION: No acute abnormality. Mild white matter changes consistent with
chronic microvascular ischemia.

## 2022-12-12 DIAGNOSIS — Z0001 Encounter for general adult medical examination with abnormal findings: Secondary | ICD-10-CM | POA: Diagnosis not present

## 2022-12-12 DIAGNOSIS — I10 Essential (primary) hypertension: Secondary | ICD-10-CM | POA: Diagnosis not present

## 2022-12-12 DIAGNOSIS — R001 Bradycardia, unspecified: Secondary | ICD-10-CM | POA: Diagnosis not present

## 2022-12-12 DIAGNOSIS — Z79899 Other long term (current) drug therapy: Secondary | ICD-10-CM | POA: Diagnosis not present

## 2022-12-12 DIAGNOSIS — Z87891 Personal history of nicotine dependence: Secondary | ICD-10-CM | POA: Diagnosis not present

## 2022-12-12 DIAGNOSIS — Z23 Encounter for immunization: Secondary | ICD-10-CM | POA: Diagnosis not present

## 2022-12-12 DIAGNOSIS — Z0000 Encounter for general adult medical examination without abnormal findings: Secondary | ICD-10-CM | POA: Diagnosis not present

## 2022-12-12 DIAGNOSIS — Z634 Disappearance and death of family member: Secondary | ICD-10-CM | POA: Diagnosis not present

## 2022-12-12 DIAGNOSIS — E782 Mixed hyperlipidemia: Secondary | ICD-10-CM | POA: Diagnosis not present

## 2022-12-26 DIAGNOSIS — Z87891 Personal history of nicotine dependence: Secondary | ICD-10-CM | POA: Diagnosis not present

## 2022-12-26 DIAGNOSIS — M792 Neuralgia and neuritis, unspecified: Secondary | ICD-10-CM | POA: Diagnosis not present

## 2022-12-26 DIAGNOSIS — I10 Essential (primary) hypertension: Secondary | ICD-10-CM | POA: Diagnosis not present

## 2022-12-26 DIAGNOSIS — Z79899 Other long term (current) drug therapy: Secondary | ICD-10-CM | POA: Diagnosis not present

## 2023-09-06 DIAGNOSIS — H43391 Other vitreous opacities, right eye: Secondary | ICD-10-CM | POA: Diagnosis not present

## 2024-01-03 DIAGNOSIS — Z0000 Encounter for general adult medical examination without abnormal findings: Secondary | ICD-10-CM | POA: Diagnosis not present

## 2024-01-03 DIAGNOSIS — T466X5A Adverse effect of antihyperlipidemic and antiarteriosclerotic drugs, initial encounter: Secondary | ICD-10-CM | POA: Diagnosis not present

## 2024-01-03 DIAGNOSIS — G8929 Other chronic pain: Secondary | ICD-10-CM | POA: Diagnosis not present

## 2024-01-03 DIAGNOSIS — Z634 Disappearance and death of family member: Secondary | ICD-10-CM | POA: Diagnosis not present

## 2024-01-03 DIAGNOSIS — G720 Drug-induced myopathy: Secondary | ICD-10-CM | POA: Diagnosis not present

## 2024-01-03 DIAGNOSIS — R001 Bradycardia, unspecified: Secondary | ICD-10-CM | POA: Diagnosis not present

## 2024-01-03 DIAGNOSIS — M79671 Pain in right foot: Secondary | ICD-10-CM | POA: Diagnosis not present

## 2024-01-03 DIAGNOSIS — Z0001 Encounter for general adult medical examination with abnormal findings: Secondary | ICD-10-CM | POA: Diagnosis not present

## 2024-01-03 DIAGNOSIS — Z23 Encounter for immunization: Secondary | ICD-10-CM | POA: Diagnosis not present

## 2024-01-03 DIAGNOSIS — E782 Mixed hyperlipidemia: Secondary | ICD-10-CM | POA: Diagnosis not present

## 2024-01-03 DIAGNOSIS — I10 Essential (primary) hypertension: Secondary | ICD-10-CM | POA: Diagnosis not present

## 2024-02-12 ENCOUNTER — Encounter: Payer: Self-pay | Admitting: *Deleted

## 2024-02-12 NOTE — Progress Notes (Signed)
 Miguel Morrow                                          MRN: [id number]   02/12/2024   The [facility] Quality Team Specialist reviewed this patient medical record for the purposes of chart review for care gap closure. The following were reviewed: chart review for care gap closure-controlling blood pressure.    [facility] Quality Team
